# Patient Record
Sex: Female | Born: 1965 | Race: White | Hispanic: No | Marital: Single | Smoking: Current every day smoker
Health system: Southern US, Community
[De-identification: ages and names within clinical notes are randomized; demographics above are authoritative.]

## PROBLEM LIST (undated history)

## (undated) DIAGNOSIS — F329 Major depressive disorder, single episode, unspecified: Secondary | ICD-10-CM

## (undated) DIAGNOSIS — F319 Bipolar disorder, unspecified: Secondary | ICD-10-CM

## (undated) DIAGNOSIS — IMO0002 Reserved for concepts with insufficient information to code with codable children: Secondary | ICD-10-CM

## (undated) DIAGNOSIS — F419 Anxiety disorder, unspecified: Secondary | ICD-10-CM

## (undated) DIAGNOSIS — F32A Depression, unspecified: Secondary | ICD-10-CM

## (undated) HISTORY — PX: TUBAL LIGATION: SHX77

## (undated) HISTORY — PX: APPENDECTOMY: SHX54

---

## 1998-08-04 ENCOUNTER — Other Ambulatory Visit: Admission: RE | Admit: 1998-08-04 | Discharge: 1998-08-04 | Payer: Self-pay | Admitting: Gynecology

## 2007-12-10 ENCOUNTER — Emergency Department (HOSPITAL_COMMUNITY): Admission: EM | Admit: 2007-12-10 | Discharge: 2007-12-10 | Payer: Self-pay | Admitting: Emergency Medicine

## 2009-08-24 ENCOUNTER — Emergency Department (HOSPITAL_COMMUNITY)
Admission: EM | Admit: 2009-08-24 | Discharge: 2009-08-24 | Payer: Self-pay | Source: Home / Self Care | Admitting: Emergency Medicine

## 2012-03-31 ENCOUNTER — Encounter (HOSPITAL_COMMUNITY): Payer: Self-pay | Admitting: Emergency Medicine

## 2012-03-31 ENCOUNTER — Emergency Department (HOSPITAL_COMMUNITY)
Admission: EM | Admit: 2012-03-31 | Discharge: 2012-03-31 | Disposition: A | Discharge status: Home / Self Care | Payer: Medicaid Other | Attending: Emergency Medicine | Admitting: Emergency Medicine

## 2012-03-31 DIAGNOSIS — S51809A Unspecified open wound of unspecified forearm, initial encounter: Secondary | ICD-10-CM | POA: Insufficient documentation

## 2012-03-31 DIAGNOSIS — F3289 Other specified depressive episodes: Secondary | ICD-10-CM | POA: Insufficient documentation

## 2012-03-31 DIAGNOSIS — Y939 Activity, unspecified: Secondary | ICD-10-CM | POA: Insufficient documentation

## 2012-03-31 DIAGNOSIS — Y9289 Other specified places as the place of occurrence of the external cause: Secondary | ICD-10-CM | POA: Insufficient documentation

## 2012-03-31 DIAGNOSIS — IMO0002 Reserved for concepts with insufficient information to code with codable children: Secondary | ICD-10-CM

## 2012-03-31 DIAGNOSIS — Z79899 Other long term (current) drug therapy: Secondary | ICD-10-CM | POA: Insufficient documentation

## 2012-03-31 DIAGNOSIS — F329 Major depressive disorder, single episode, unspecified: Secondary | ICD-10-CM | POA: Insufficient documentation

## 2012-03-31 DIAGNOSIS — F411 Generalized anxiety disorder: Secondary | ICD-10-CM | POA: Insufficient documentation

## 2012-03-31 DIAGNOSIS — W268XXA Contact with other sharp object(s), not elsewhere classified, initial encounter: Secondary | ICD-10-CM | POA: Insufficient documentation

## 2012-03-31 DIAGNOSIS — F17200 Nicotine dependence, unspecified, uncomplicated: Secondary | ICD-10-CM | POA: Insufficient documentation

## 2012-03-31 DIAGNOSIS — F319 Bipolar disorder, unspecified: Secondary | ICD-10-CM | POA: Insufficient documentation

## 2012-03-31 HISTORY — DX: Anxiety disorder, unspecified: F41.9

## 2012-03-31 HISTORY — DX: Bipolar disorder, unspecified: F31.9

## 2012-03-31 HISTORY — DX: Depression, unspecified: F32.A

## 2012-03-31 HISTORY — DX: Major depressive disorder, single episode, unspecified: F32.9

## 2012-03-31 MED ORDER — LIDOCAINE-EPINEPHRINE (PF) 1 %-1:200000 IJ SOLN
10.0000 mL | Freq: Once | INTRAMUSCULAR | Status: AC
  Administered 2012-03-31: 10 mL via INTRADERMAL
  Filled 2012-03-31: qty 10

## 2012-03-31 NOTE — ED Provider Notes (Signed)
History     CSN: [id number]  Arrival date & time 03/31/12  0302   First MD Initiated Contact with Patient 03/31/12 [id number]      Chief Complaint  Patient presents with  . Laceration    (Consider location/radiation/quality/duration/timing/severity/associated sxs/prior treatment) Patient is a 47 y.o. female presenting with skin laceration. The history is provided by the patient (pt states she cut her left forearm accidently).  Laceration Location: left forearm. Length (cm):  5cm Depth:  Cutaneous Quality: straight   Bleeding: controlled   Time since incident: 2 hours ago. Laceration mechanism:  Metal edge Pain details:    Quality:  Aching   Severity:  Mild   Timing:  Constant   Past Medical History  Diagnosis Date  . Anxiety   . Depression   . Bipolar 1 disorder     Past Surgical History  Procedure Laterality Date  . Appendectomy    . Tubal ligation      History reviewed. No pertinent family history.  History  Substance Use Topics  . Smoking status: Current Every Day Smoker    Types: Cigarettes  . Smokeless tobacco: Not on file  . Alcohol Use: Yes    OB History   Grav Para Term Preterm Abortions TAB SAB Ect Mult Living                  Review of Systems  Constitutional: Negative for fatigue.  HENT: Negative for congestion, sinus pressure and ear discharge.   Eyes: Negative for discharge.  Respiratory: Negative for cough.   Cardiovascular: Negative for chest pain.  Gastrointestinal: Negative for abdominal pain and diarrhea.  Genitourinary: Negative for frequency and hematuria.  Musculoskeletal: Negative for back pain.       Lac.  Right forearm  Skin: Negative for rash.  Neurological: Negative for seizures and headaches.  Psychiatric/Behavioral: Negative for hallucinations.    Allergies  Codeine and Penicillins  Home Medications   Current Outpatient Rx  Name  Route  Sig  Dispense  Refill  . amitriptyline (ELAVIL) 150 MG tablet   Oral   Take  150 mg by mouth at bedtime.         Marland Kitchen ibuprofen (ADVIL,MOTRIN) 800 MG tablet   Oral   Take 800 mg by mouth every 8 (eight) hours as needed for pain.         Marland Kitchen PARoxetine (PAXIL) 40 MG tablet   Oral   Take 40 mg by mouth every morning.           BP 102/49  Pulse 105  Temp(Src) 97.6 F (36.4 C) (Oral)  Resp 20  Ht 5\' 8"  (1.727 m)  Wt 125 lb (56.7 kg)  BMI 19.01 kg/m2  SpO2 96%  LMP 03/18/2012  Physical Exam  Constitutional: She is oriented to person, place, and time. She appears well-developed.  HENT:  Head: Normocephalic.  Eyes: Conjunctivae are normal.  Neck: No tracheal deviation present.  Cardiovascular:  No murmur heard. Musculoskeletal: Normal range of motion.  5cm lac superficial left forearm. neurovasc normal  Neurological: She is oriented to person, place, and time.  Skin: Skin is warm.  Psychiatric: She has a normal mood and affect.    ED Course  LACERATION REPAIR Date/Time: 03/31/2012 3:57 AM Performed by: Shemeika Starzyk L Authorized by: Bethann Berkshire L Comments: 5 cm  Superficial laceration to right forearm closed with 6 4-0 nylon sutures.  Area numbed with lidocaine and epi.  Pt tolerated the procedure well   (including  critical care time)  Labs Reviewed - No data to display No results found.   1. Laceration       MDM          Benny Lennert, MD 03/31/12 [phone]

## 2012-03-31 NOTE — ED Notes (Signed)
Pt has laceration to the rt forearm from reaching under an entertainment center.

## 2012-04-10 ENCOUNTER — Encounter (HOSPITAL_COMMUNITY): Payer: Self-pay | Admitting: Emergency Medicine

## 2012-04-10 ENCOUNTER — Emergency Department (HOSPITAL_COMMUNITY)
Admission: EM | Admit: 2012-04-10 | Discharge: 2012-04-10 | Disposition: A | Discharge status: Home / Self Care | Payer: Medicaid Other | Attending: Emergency Medicine | Admitting: Emergency Medicine

## 2012-04-10 DIAGNOSIS — F411 Generalized anxiety disorder: Secondary | ICD-10-CM | POA: Insufficient documentation

## 2012-04-10 DIAGNOSIS — K029 Dental caries, unspecified: Secondary | ICD-10-CM

## 2012-04-10 DIAGNOSIS — Z79899 Other long term (current) drug therapy: Secondary | ICD-10-CM | POA: Insufficient documentation

## 2012-04-10 DIAGNOSIS — K089 Disorder of teeth and supporting structures, unspecified: Secondary | ICD-10-CM | POA: Insufficient documentation

## 2012-04-10 DIAGNOSIS — F17200 Nicotine dependence, unspecified, uncomplicated: Secondary | ICD-10-CM | POA: Insufficient documentation

## 2012-04-10 DIAGNOSIS — Z4802 Encounter for removal of sutures: Secondary | ICD-10-CM

## 2012-04-10 DIAGNOSIS — F3289 Other specified depressive episodes: Secondary | ICD-10-CM | POA: Insufficient documentation

## 2012-04-10 DIAGNOSIS — Z4800 Encounter for change or removal of nonsurgical wound dressing: Secondary | ICD-10-CM | POA: Insufficient documentation

## 2012-04-10 MED ORDER — HYDROCODONE-ACETAMINOPHEN 10-500 MG PO TABS: 1.0000 | ORAL_TABLET | Freq: Four times a day (QID) | ORAL | Status: DC | PRN

## 2012-04-10 MED ORDER — OXYCODONE-ACETAMINOPHEN 5-325 MG PO TABS: 1.0000 | ORAL_TABLET | Freq: Four times a day (QID) | ORAL | Status: DC | PRN

## 2012-04-10 MED ORDER — CLINDAMYCIN HCL 300 MG PO CAPS: 300.0000 mg | ORAL_CAPSULE | Freq: Three times a day (TID) | ORAL | Status: DC

## 2012-04-10 NOTE — ED Provider Notes (Signed)
History  This chart was scribed for Ebbie Ridge, non-physician practitioner, working with Raeford Razor, MD by Bennett Scrape, ED Scribe. This patient was seen in room WTR5/WTR5 and the patient's care was started at 3:54 PM.  CSN: [id number]  Arrival date & time 04/10/12  1550   First MD Initiated Contact with Patient 04/10/12 1554      No chief complaint on file.    The history is provided by the patient. No language interpreter was used.    Tracie Jimenez is a 47 y.o. female who presents to the Emergency Department for suture removal. Pt was seen at [facility] ED 10 days ago for the same and had 6 sutures placed. She reports soreness and states that 3 "busted on their own" but denies drainage. She also c/o dental pain after a right upper tooth broke 2 days ago. She reports taking "someone's 10 mg percocet" with no improvement. She denies having a dentist to follow up with.She denies fevers, nausea, and emesis as associated symptoms.  Past Medical History  Diagnosis Date  . Anxiety   . Depression   . Bipolar 1 disorder     Past Surgical History  Procedure Laterality Date  . Appendectomy    . Tubal ligation      No family history on file.  History  Substance Use Topics  . Smoking status: Current Every Day Smoker    Types: Cigarettes  . Smokeless tobacco: Not on file  . Alcohol Use: Yes    No OB history provided.  Review of Systems  A complete 10 system review of systems was obtained and all systems are negative except as noted in the HPI and PMH.   Allergies  Codeine and Penicillins  Home Medications   Current Outpatient Rx  Name  Route  Sig  Dispense  Refill  . amitriptyline (ELAVIL) 150 MG tablet   Oral   Take 150 mg by mouth at bedtime.         Marland Kitchen ibuprofen (ADVIL,MOTRIN) 800 MG tablet   Oral   Take 800 mg by mouth every 8 (eight) hours as needed for pain.         Marland Kitchen PARoxetine (PAXIL) 40 MG tablet   Oral   Take 40 mg by mouth every morning.            Triage Vitals: BP 98/69  Pulse 75  Temp(Src) 98 F (36.7 C) (Oral)  SpO2 100%  LMP 03/18/2012   Physical Exam  Nursing note and vitals reviewed. Constitutional: She is oriented to person, place, and time. She appears well-developed and well-nourished. No distress.  HENT:  Head: Normocephalic and atraumatic.  Dental decay on throughout, several broken teeth on the right  Eyes: Conjunctivae and EOM are normal.  Neck: Neck supple. No tracheal deviation present.  Cardiovascular: Normal rate.   Pulmonary/Chest: Effort normal. No respiratory distress.  Musculoskeletal: Normal range of motion.  Neurological: She is alert and oriented to person, place, and time. She has normal strength. No sensory deficit. She displays no seizure activity.  Skin: Skin is warm and dry.  Mild erythema surrounding the sutured area on the right forearm, no other signs of infection  Psychiatric: She has a normal mood and affect.    ED Course  Procedures (including critical care time)  DIAGNOSTIC STUDIES: Oxygen Saturation is 100% on room air, normal by my interpretation.    COORDINATION OF CARE: 3:58 PM-Informed pt of suture removal plan. Discussed discharge plan which includes dental  referral with pt and pt agreed to plan.   The patient is advised to return here as needed.Sutures were removed. The patient is referred to dentistry as well.    MDM  I personally performed the services described in this documentation, which was scribed in my presence. The recorded information has been reviewed and is accurate.    Carlyle Dolly, PA-C 04/10/12 1626

## 2012-04-10 NOTE — ED Provider Notes (Signed)
Medical screening examination/treatment/procedure(s) were performed by non-physician practitioner and as supervising physician I was immediately available for consultation/collaboration.  Raeford Razor, MD 04/10/12 1640

## 2012-04-10 NOTE — ED Notes (Signed)
Pt states that she needs her sutures removed from 2/14 on her R forearm and she also has a tooth that broke on her R upper side that is now cutting into her jaw. Painful. Unable to eat on that side.

## 2012-05-21 ENCOUNTER — Emergency Department (HOSPITAL_COMMUNITY)
Admission: EM | Admit: 2012-05-21 | Discharge: 2012-05-22 | Disposition: A | Discharge status: Home / Self Care | Payer: Medicaid Other | Attending: Emergency Medicine | Admitting: Emergency Medicine

## 2012-05-21 DIAGNOSIS — K089 Disorder of teeth and supporting structures, unspecified: Secondary | ICD-10-CM | POA: Insufficient documentation

## 2012-05-21 DIAGNOSIS — Z79899 Other long term (current) drug therapy: Secondary | ICD-10-CM | POA: Insufficient documentation

## 2012-05-21 DIAGNOSIS — F319 Bipolar disorder, unspecified: Secondary | ICD-10-CM | POA: Insufficient documentation

## 2012-05-21 DIAGNOSIS — F411 Generalized anxiety disorder: Secondary | ICD-10-CM | POA: Insufficient documentation

## 2012-05-21 DIAGNOSIS — K0889 Other specified disorders of teeth and supporting structures: Secondary | ICD-10-CM

## 2012-05-21 DIAGNOSIS — F17200 Nicotine dependence, unspecified, uncomplicated: Secondary | ICD-10-CM | POA: Insufficient documentation

## 2012-05-21 MED ORDER — HYDROCODONE-ACETAMINOPHEN 5-325 MG PO TABS: ORAL_TABLET | ORAL | Status: DC

## 2012-05-21 MED ORDER — CLINDAMYCIN HCL 150 MG PO CAPS: 300.0000 mg | ORAL_CAPSULE | Freq: Four times a day (QID) | ORAL | Status: DC

## 2012-05-21 MED ORDER — IBUPROFEN 600 MG PO TABS: 600.0000 mg | ORAL_TABLET | Freq: Four times a day (QID) | ORAL | Status: DC | PRN

## 2012-05-21 NOTE — ED Provider Notes (Signed)
History    This chart was scribed for non-physician practitioner, Renne Crigler PA-C working with Sunnie Nielsen, MD by Smitty Pluck, ED scribe. This patient was seen in room WTR5/WTR5 and the patient's care was started at 11: 51 PM.   CSN: [id number]  Arrival date & time 05/21/12  2341        Chief Complaint  Patient presents with  . Dental Pain    The history is provided by the patient. No language interpreter was used.   Tracie Jimenez is a 47 y.o. female who presents to the Emergency Department complaining of constant, moderate throbbing left dental pain onset 3 days ago. Pt reports she has sensitivity to air and when she breathes pain is aggravated. Pt states she has taken ibuprofen and percocet-10 without relief. Pt denies fever, chills, nausea, vomiting, diarrhea, weakness, cough, SOB and any other pain.    Past Medical History  Diagnosis Date  . Anxiety   . Depression   . Bipolar 1 disorder     Past Surgical History  Procedure Laterality Date  . Appendectomy    . Tubal ligation      No family history on file.  History  Substance Use Topics  . Smoking status: Current Every Day Smoker    Types: Cigarettes  . Smokeless tobacco: Not on file  . Alcohol Use: Yes    OB History   Grav Para Term Preterm Abortions TAB SAB Ect Mult Living                  Review of Systems  Constitutional: Negative for fever and chills.  HENT: Positive for dental problem. Negative for ear pain, sore throat, facial swelling, trouble swallowing and neck pain.   Respiratory: Negative for cough, shortness of breath and stridor.   Gastrointestinal: Negative for nausea and vomiting.  Skin: Negative for color change.  Neurological: Negative for weakness and headaches.    Allergies  Codeine and Penicillins  Home Medications   Current Outpatient Rx  Name  Route  Sig  Dispense  Refill  . albuterol (PROVENTIL HFA;VENTOLIN HFA) 108 (90 BASE) MCG/ACT inhaler   Inhalation   Inhale 2  puffs into the lungs every 6 (six) hours as needed for wheezing. wheezing         . ALPRAZolam (XANAX) 0.5 MG tablet   Oral   Take 0.5 mg by mouth at bedtime as needed for sleep. Anxiety         . amitriptyline (ELAVIL) 150 MG tablet   Oral   Take 150 mg by mouth at bedtime.         . clindamycin (CLEOCIN) 300 MG capsule   Oral   Take 1 capsule (300 mg total) by mouth 3 (three) times daily.   21 capsule   0   . oxyCODONE-acetaminophen (PERCOCET/ROXICET) 5-325 MG per tablet   Oral   Take 1 tablet by mouth every 6 (six) hours as needed for pain.   10 tablet   0   . PARoxetine (PAXIL) 40 MG tablet   Oral   Take 40 mg by mouth every morning.           BP 123/78  Pulse 89  Temp(Src) 98.7 F (37.1 C) (Oral)  Resp 20  SpO2 99%  Physical Exam  Nursing note and vitals reviewed. Constitutional: She appears well-developed and well-nourished. No distress.  HENT:  Head: Normocephalic and atraumatic. No trismus in the jaw.  Right Ear: Tympanic membrane, external ear  and ear canal normal.  Left Ear: Tympanic membrane, external ear and ear canal normal.  Nose: Nose normal.  Mouth/Throat: Uvula is midline, oropharynx is clear and moist and mucous membranes are normal. Abnormal dentition. Dental caries present. No dental abscesses or edematous. No tonsillar abscesses.  Advanced periodontal disease The left first molar is absent and second molar is broken Multiple caries are present  No gross abscess   Eyes: Conjunctivae and EOM are normal.  Neck: Normal range of motion. Neck supple. No tracheal deviation present.  No neck swelling or Ludwig's angina  Cardiovascular: Normal rate.   Pulmonary/Chest: Effort normal. No respiratory distress.  Musculoskeletal: Normal range of motion.  Lymphadenopathy:    She has no cervical adenopathy.  Neurological: She is alert.  Skin: Skin is warm and dry.  Psychiatric: She has a normal mood and affect. Her behavior is normal.    ED  Course  Procedures (including critical care time)     COORDINATION OF CARE: 11:56 PM Discussed ED treatment with pt and pt agrees.   Filed Vitals:   05/22/12 0011  BP: 123/78  Pulse: 89  Temp: 98.7 F (37.1 C)  TempSrc: Oral  Resp: 20  SpO2: 99%     Labs Reviewed - No data to display No results found.   1. Pain, dental     12:32 AM Patient seen and examined. Work-up initiated. Medications ordered.   Vital signs reviewed and are as follows: Filed Vitals:   05/22/12 0011  BP: 123/78  Pulse: 89  Temp: 98.7 F (37.1 C)  Resp: 20    Patient counseled to take prescribed medications as directed, return with worsening facial or neck swelling, and to follow-up with their dentist as soon as possible.   Dental block was performed. 1.31mL of 2% lidocaine with epi was combined with 1.7mL 0.5% bupivacaine with epi and an alveolar block was performed. Injections made at base of tooth as well as the tooth immediately posterior and anterior to tooth. Adequate anesthesia was obtained. Minimal bleeding after injections. Patient tolerated procedure well with no immediate complications.   Patient counseled on use of narcotic pain medications. Counseled not to combine these medications with others containing tylenol. Urged not to drink alcohol, drive, or perform any other activities that requires focus while taking these medications. The patient verbalizes understanding and agrees with the plan.    MDM  Patient with toothache.  No gross abscess.  Exam unconcerning for Ludwig's angina or other deep tissue infection in neck.  Will treat with clinda (pen allergy) and pain medicine.  Urged patient to follow-up with dentist.        I personally performed the services described in this documentation, which was scribed in my presence. The recorded information has been reviewed and is accurate.    Renne Crigler, PA-C 05/22/12 [phone]

## 2012-05-22 ENCOUNTER — Encounter (HOSPITAL_COMMUNITY): Payer: Self-pay | Admitting: Family Medicine

## 2012-05-22 MED ORDER — IBUPROFEN 600 MG PO TABS: 600.0000 mg | ORAL_TABLET | Freq: Four times a day (QID) | ORAL | Status: DC | PRN

## 2012-05-22 MED ORDER — BUPIVACAINE-EPINEPHRINE (PF) 0.5% -1:200000 IJ SOLN
INTRAMUSCULAR | Status: AC
  Administered 2012-05-22: 150 mg
  Filled 2012-05-22: qty 10

## 2012-05-22 MED ORDER — OXYCODONE-ACETAMINOPHEN 5-325 MG PO TABS: 1.0000 | ORAL_TABLET | Freq: Four times a day (QID) | ORAL | Status: DC | PRN

## 2012-05-22 MED ORDER — CLINDAMYCIN HCL 150 MG PO CAPS: 300.0000 mg | ORAL_CAPSULE | Freq: Four times a day (QID) | ORAL | Status: DC

## 2012-05-22 MED FILL — Bupivacaine Inj 0.5% w/ Epinephrine 1:200000: INTRAMUSCULAR | Qty: 50 | Status: AC

## 2012-05-22 NOTE — ED Provider Notes (Signed)
Medical screening examination/treatment/procedure(s) were performed by non-physician practitioner and as supervising physician I was immediately available for consultation/collaboration.  Canyon Willow, MD 05/22/12 0222 

## 2012-05-22 NOTE — ED Notes (Signed)
Patient states that she has had a toothache for the past 3 days.

## 2012-05-25 ENCOUNTER — Encounter (HOSPITAL_COMMUNITY): Payer: Self-pay | Admitting: Family Medicine

## 2012-05-25 ENCOUNTER — Emergency Department (HOSPITAL_COMMUNITY)
Admission: EM | Admit: 2012-05-25 | Discharge: 2012-05-25 | Disposition: A | Discharge status: Home / Self Care | Payer: Medicaid Other | Attending: Emergency Medicine | Admitting: Emergency Medicine

## 2012-05-25 DIAGNOSIS — K0889 Other specified disorders of teeth and supporting structures: Secondary | ICD-10-CM

## 2012-05-25 DIAGNOSIS — F329 Major depressive disorder, single episode, unspecified: Secondary | ICD-10-CM | POA: Insufficient documentation

## 2012-05-25 DIAGNOSIS — F319 Bipolar disorder, unspecified: Secondary | ICD-10-CM | POA: Insufficient documentation

## 2012-05-25 DIAGNOSIS — Z79899 Other long term (current) drug therapy: Secondary | ICD-10-CM | POA: Insufficient documentation

## 2012-05-25 DIAGNOSIS — F411 Generalized anxiety disorder: Secondary | ICD-10-CM | POA: Insufficient documentation

## 2012-05-25 DIAGNOSIS — K089 Disorder of teeth and supporting structures, unspecified: Secondary | ICD-10-CM | POA: Insufficient documentation

## 2012-05-25 DIAGNOSIS — F3289 Other specified depressive episodes: Secondary | ICD-10-CM | POA: Insufficient documentation

## 2012-05-25 DIAGNOSIS — F17200 Nicotine dependence, unspecified, uncomplicated: Secondary | ICD-10-CM | POA: Insufficient documentation

## 2012-05-25 MED ORDER — OXYCODONE-ACETAMINOPHEN 5-325 MG PO TABS
2.0000 | ORAL_TABLET | Freq: Once | ORAL | Status: AC
  Administered 2012-05-25: 2 via ORAL
  Filled 2012-05-25: qty 2

## 2012-05-25 MED ORDER — BUPIVACAINE-EPINEPHRINE PF 0.5-1:200000 % IJ SOLN
1.8000 mL | Freq: Once | INTRAMUSCULAR | Status: DC
  Filled 2012-05-25: qty 1.8

## 2012-05-25 MED ORDER — OXYCODONE-ACETAMINOPHEN 5-325 MG PO TABS: 2.0000 | ORAL_TABLET | Freq: Four times a day (QID) | ORAL | Status: DC | PRN

## 2012-05-25 NOTE — ED Provider Notes (Signed)
Medical screening examination/treatment/procedure(s) were performed by non-physician practitioner and as supervising physician I was immediately available for consultation/collaboration.  Gilda Crease, MD 05/25/12 2229

## 2012-05-25 NOTE — ED Notes (Signed)
Patient states she was seen here on 4/6 and was given medication for pain and a resource guide. States that she called to make a dental appointment and was told the soonest she could be seen would be next Tuesday; patient did not take this appointment. States the pain is getting worse and she is having to double up on her medicine to control her pain. Bruising and edema noted to left jaw.

## 2012-05-25 NOTE — ED Notes (Signed)
Pt states she has a ride home. 

## 2012-05-25 NOTE — ED Provider Notes (Signed)
History     CSN: [id number]  Arrival date & time 05/25/12  1949   First MD Initiated Contact with Patient 05/25/12 2000      Chief Complaint  Patient presents with  . Dental Pain    (Consider location/radiation/quality/duration/timing/severity/associated sxs/prior treatment) HPI Comments: Patient presents to the emergency department with a dental complaint. Symptoms began one week ago. The patient has tried to alleviate pain with percocet and clindamycin.  Pain rated at a 10/10, characterized as throbbing in nature and located left upper teeth. Patient endorses subjective fever, but did not take her temperature.  She is afebrile here. Denies night sweats, chills, difficulty swallowing or opening mouth, SOB, nuchal rigidity or decreased ROM of neck.  Patient states that she has a dentist appointment next Tuesday.   The history is provided by the patient. No language interpreter was used.    Past Medical History  Diagnosis Date  . Anxiety   . Depression   . Bipolar 1 disorder     Past Surgical History  Procedure Laterality Date  . Appendectomy    . Tubal ligation      No family history on file.  History  Substance Use Topics  . Smoking status: Current Every Day Smoker    Types: Cigarettes  . Smokeless tobacco: Not on file  . Alcohol Use: Yes    OB History   Grav Para Term Preterm Abortions TAB SAB Ect Mult Living                  Review of Systems  All other systems reviewed and are negative.    Allergies  Codeine and Penicillins  Home Medications   Current Outpatient Rx  Name  Route  Sig  Dispense  Refill  . albuterol (PROVENTIL HFA;VENTOLIN HFA) 108 (90 BASE) MCG/ACT inhaler   Inhalation   Inhale 2 puffs into the lungs every 6 (six) hours as needed for wheezing. wheezing         . ALPRAZolam (XANAX) 0.5 MG tablet   Oral   Take 0.5 mg by mouth at bedtime as needed for sleep. Anxiety         . amitriptyline (ELAVIL) 150 MG tablet   Oral   Take  150 mg by mouth at bedtime.         . clindamycin (CLEOCIN) 150 MG capsule   Oral   Take 2 capsules (300 mg total) by mouth every 6 (six) hours.   56 capsule   0   . ibuprofen (ADVIL,MOTRIN) 600 MG tablet   Oral   Take 1 tablet (600 mg total) by mouth every 6 (six) hours as needed for pain.   20 tablet   0   . oxyCODONE-acetaminophen (PERCOCET/ROXICET) 5-325 MG per tablet   Oral   Take 1-2 tablets by mouth every 6 (six) hours as needed for pain.   8 tablet   0   . PARoxetine (PAXIL) 40 MG tablet   Oral   Take 40 mg by mouth every morning.           BP 138/84  Pulse 95  Temp(Src) 98.7 F (37.1 C) (Oral)  Resp 20  SpO2 100%  LMP 05/02/2012  Physical Exam  Nursing note and vitals reviewed. Constitutional: She is oriented to person, place, and time. She appears well-developed and well-nourished.  HENT:  Head: Normocephalic and atraumatic.  Mouth/Throat:    Poor dentition throughout.  Affected tooth as diagrammed.  No signs of peritonsillar or tonsillar  abscess.  No signs of gingival abscess. Oropharynx is clear and without exudates.  Uvula is midline.  Airway is intact. No signs of Ludwig's angina.   Eyes: Conjunctivae and EOM are normal.  Neck: Normal range of motion.  Cardiovascular: Normal rate.   Pulmonary/Chest: Effort normal.  Abdominal: She exhibits no distension.  Musculoskeletal: Normal range of motion.  Neurological: She is alert and oriented to person, place, and time.  Skin: Skin is dry.  Psychiatric: She has a normal mood and affect. Her behavior is normal. Judgment and thought content normal.    ED Course  Dental Date/Time: 05/25/2012 8:49 PM Performed by: Roxy Horseman Authorized by: Roxy Horseman Consent: Verbal consent obtained. Risks and benefits: risks, benefits and alternatives were discussed Consent given by: patient Patient understanding: patient states understanding of the procedure being performed Patient consent: the  patient's understanding of the procedure matches consent given Procedure consent: procedure consent matches procedure scheduled Relevant documents: relevant documents present and verified Test results: test results available and properly labeled Imaging studies: imaging studies available Required items: required blood products, implants, devices, and special equipment available Patient identity confirmed: verbally with patient Time out: Immediately prior to procedure a "time out" was called to verify the correct patient, procedure, equipment, support staff and site/side marked as required. Local anesthesia used: yes Anesthesia: local infiltration Local anesthetic: bupivacaine 0.5% with epinephrine Anesthetic total: 1.8 ml Patient sedated: no Patient tolerance: Patient tolerated the procedure well with no immediate complications.   (including critical care time)  Labs Reviewed - No data to display No results found.   1. Pain, dental       MDM  Patient with uncomplicated dental pain. She was seen here 4 days ago for the same. Patient states that she has run out of her pain medicine, and was unable to followup with a dentist yet. She has an appointment scheduled for Tuesday. She has been taking clindamycin as prescribed. She requests another dental block, and additional pain medicine. I told her that I will give her more pain medicine, but that if she returns without having seen the dentist, that we will be unable to refill her prescription again. She is afebrile, there is no signs of abscess. Patient is stable and ready for discharge.       Roxy Horseman, PA-C 05/25/12 2050

## 2012-10-30 ENCOUNTER — Emergency Department (HOSPITAL_COMMUNITY)
Admission: EM | Admit: 2012-10-30 | Discharge: 2012-10-30 | Disposition: A | Discharge status: Home / Self Care | Payer: Medicaid Other | Attending: Emergency Medicine | Admitting: Emergency Medicine

## 2012-10-30 ENCOUNTER — Encounter (HOSPITAL_COMMUNITY): Payer: Self-pay | Admitting: *Deleted

## 2012-10-30 DIAGNOSIS — S0993XA Unspecified injury of face, initial encounter: Secondary | ICD-10-CM | POA: Insufficient documentation

## 2012-10-30 DIAGNOSIS — S8990XA Unspecified injury of unspecified lower leg, initial encounter: Secondary | ICD-10-CM | POA: Insufficient documentation

## 2012-10-30 DIAGNOSIS — W172XXA Fall into hole, initial encounter: Secondary | ICD-10-CM | POA: Insufficient documentation

## 2012-10-30 DIAGNOSIS — S40269A Insect bite (nonvenomous) of unspecified shoulder, initial encounter: Secondary | ICD-10-CM | POA: Insufficient documentation

## 2012-10-30 DIAGNOSIS — R197 Diarrhea, unspecified: Secondary | ICD-10-CM | POA: Insufficient documentation

## 2012-10-30 DIAGNOSIS — Z880 Allergy status to penicillin: Secondary | ICD-10-CM | POA: Insufficient documentation

## 2012-10-30 DIAGNOSIS — F411 Generalized anxiety disorder: Secondary | ICD-10-CM | POA: Insufficient documentation

## 2012-10-30 DIAGNOSIS — Y9289 Other specified places as the place of occurrence of the external cause: Secondary | ICD-10-CM | POA: Insufficient documentation

## 2012-10-30 DIAGNOSIS — F319 Bipolar disorder, unspecified: Secondary | ICD-10-CM | POA: Insufficient documentation

## 2012-10-30 DIAGNOSIS — Z79899 Other long term (current) drug therapy: Secondary | ICD-10-CM | POA: Insufficient documentation

## 2012-10-30 DIAGNOSIS — F17200 Nicotine dependence, unspecified, uncomplicated: Secondary | ICD-10-CM | POA: Insufficient documentation

## 2012-10-30 DIAGNOSIS — IMO0002 Reserved for concepts with insufficient information to code with codable children: Secondary | ICD-10-CM | POA: Insufficient documentation

## 2012-10-30 DIAGNOSIS — W57XXXA Bitten or stung by nonvenomous insect and other nonvenomous arthropods, initial encounter: Secondary | ICD-10-CM | POA: Insufficient documentation

## 2012-10-30 DIAGNOSIS — R509 Fever, unspecified: Secondary | ICD-10-CM | POA: Insufficient documentation

## 2012-10-30 DIAGNOSIS — Y9389 Activity, other specified: Secondary | ICD-10-CM | POA: Insufficient documentation

## 2012-10-30 DIAGNOSIS — G8929 Other chronic pain: Secondary | ICD-10-CM

## 2012-10-30 MED ORDER — HYDROCODONE-IBUPROFEN 7.5-200 MG PO TABS: 1.0000 | ORAL_TABLET | Freq: Four times a day (QID) | ORAL | Status: DC | PRN

## 2012-10-30 MED ORDER — OXYCODONE HCL 5 MG PO TABS: 5.0000 mg | ORAL_TABLET | ORAL | Status: DC | PRN
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
## 2012-10-30 NOTE — ED Notes (Signed)
Pt states has spider bite right upper chest that has gotten bigger; states feel in a hole yesterday; c/o knee and lower back pain; history of chronic pain;

## 2012-10-31 NOTE — ED Provider Notes (Signed)
Medical screening examination/treatment/procedure(s) were performed by non-physician practitioner and as supervising physician I was immediately available for consultation/collaboration.    Carver Murakami J. Tali Cleaves, MD 10/31/12 1709 

## 2013-04-16 ENCOUNTER — Emergency Department (HOSPITAL_COMMUNITY): Payer: Medicaid Other

## 2013-04-16 ENCOUNTER — Encounter (HOSPITAL_COMMUNITY): Payer: Self-pay | Admitting: Emergency Medicine

## 2013-04-16 ENCOUNTER — Emergency Department (HOSPITAL_COMMUNITY)
Admission: EM | Admit: 2013-04-16 | Discharge: 2013-04-16 | Disposition: A | Discharge status: Home / Self Care | Payer: Medicaid Other | Attending: Emergency Medicine | Admitting: Emergency Medicine

## 2013-04-16 DIAGNOSIS — F17200 Nicotine dependence, unspecified, uncomplicated: Secondary | ICD-10-CM | POA: Insufficient documentation

## 2013-04-16 DIAGNOSIS — F319 Bipolar disorder, unspecified: Secondary | ICD-10-CM | POA: Insufficient documentation

## 2013-04-16 DIAGNOSIS — W010XXA Fall on same level from slipping, tripping and stumbling without subsequent striking against object, initial encounter: Secondary | ICD-10-CM | POA: Insufficient documentation

## 2013-04-16 DIAGNOSIS — Z79899 Other long term (current) drug therapy: Secondary | ICD-10-CM | POA: Insufficient documentation

## 2013-04-16 DIAGNOSIS — IMO0002 Reserved for concepts with insufficient information to code with codable children: Secondary | ICD-10-CM | POA: Insufficient documentation

## 2013-04-16 DIAGNOSIS — Y9389 Activity, other specified: Secondary | ICD-10-CM | POA: Insufficient documentation

## 2013-04-16 DIAGNOSIS — F411 Generalized anxiety disorder: Secondary | ICD-10-CM | POA: Insufficient documentation

## 2013-04-16 DIAGNOSIS — M545 Low back pain, unspecified: Secondary | ICD-10-CM

## 2013-04-16 DIAGNOSIS — Y9289 Other specified places as the place of occurrence of the external cause: Secondary | ICD-10-CM | POA: Insufficient documentation

## 2013-04-16 DIAGNOSIS — W19XXXA Unspecified fall, initial encounter: Secondary | ICD-10-CM

## 2013-04-16 MED ORDER — PREDNISONE 20 MG PO TABS
60.0000 mg | ORAL_TABLET | Freq: Once | ORAL | Status: AC
  Administered 2013-04-16: 60 mg via ORAL
  Filled 2013-04-16: qty 3

## 2013-04-16 MED ORDER — KETOROLAC TROMETHAMINE 60 MG/2ML IM SOLN
60.0000 mg | Freq: Once | INTRAMUSCULAR | Status: DC
Start: 2013-04-16 — End: 2013-04-16

## 2013-04-16 MED ORDER — CYCLOBENZAPRINE HCL 10 MG PO TABS: 10.0000 mg | ORAL_TABLET | Freq: Two times a day (BID) | ORAL | Status: DC | PRN

## 2013-04-16 MED ORDER — PROMETHAZINE HCL 25 MG PO TABS: 25.0000 mg | ORAL_TABLET | Freq: Once | ORAL | Status: DC

## 2013-04-16 MED ORDER — OXYCODONE HCL 5 MG PO TABS: 5.0000 mg | ORAL_TABLET | ORAL | Status: DC | PRN

## 2013-04-16 MED ORDER — HYDROMORPHONE HCL PF 1 MG/ML IJ SOLN
1.0000 mg | Freq: Once | INTRAMUSCULAR | Status: AC
  Administered 2013-04-16: 1 mg via INTRAMUSCULAR
  Filled 2013-04-16: qty 1

## 2013-04-16 NOTE — Discharge Instructions (Signed)
Back Injury Prevention °Back injuries can be extremely painful and difficult to heal. After having one back injury, you are much more likely to experience another later on. It is important to learn how to avoid injuring or re-injuring your back. The following tips can help you to prevent a back injury. °PHYSICAL FITNESS °· Exercise regularly and try to develop good tone in your abdominal muscles. Your abdominal muscles provide a lot of the support needed by your back. °· Do aerobic exercises (walking, jogging, biking, swimming) regularly. °· Do exercises that increase balance and strength (tai chi, yoga) regularly. This can decrease your risk of falling and injuring your back. °· Stretch before and after exercising. °· Maintain a healthy weight. The more you weigh, the more stress is placed on your back. For every pound of weight, 10 times that amount of pressure is placed on the back. °DIET °· Talk to your caregiver about how much calcium and vitamin D you need per day. These nutrients help to prevent weakening of the bones (osteoporosis). Osteoporosis can cause broken (fractured) bones that lead to back pain. °· Include good sources of calcium in your diet, such as dairy products, green, leafy vegetables, and products with calcium added (fortified). °· Include good sources of vitamin D in your diet, such as milk and foods that are fortified with vitamin D. °· Consider taking a nutritional supplement or a multivitamin if needed. °· Stop smoking if you smoke. °POSTURE °· Sit and stand up straight. Avoid leaning forward when you sit or hunching over when you stand. °· Choose chairs with good low back (lumbar) support. °· If you work at a desk, sit close to your work so you do not need to lean over. Keep your chin tucked in. Keep your neck drawn back and elbows bent at a right angle. Your arms should look like the letter "L." °· Sit high and close to the steering wheel when you drive. Add a lumbar support to your car  seat if needed. °· Avoid sitting or standing in one position for too long. Take breaks to get up, stretch, and walk around at least once every hour. Take breaks if you are driving for long periods of time. °· Sleep on your side with your knees slightly bent, or sleep on your back with a pillow under your knees. Do not sleep on your stomach. °LIFTING, TWISTING, AND REACHING °· Avoid heavy lifting, especially repetitive lifting. If you must do heavy lifting: °· Stretch before lifting. °· Work slowly. °· Rest between lifts. °· Use carts and dollies to move objects when possible. °· Make several small trips instead of carrying 1 heavy load. °· Ask for help when you need it. °· Ask for help when moving big, awkward objects. °· Follow these steps when lifting: °· Stand with your feet shoulder-width apart. °· Get as close to the object as you can. Do not try to pick up heavy objects that are far from your body. °· Use handles or lifting straps if they are available. °· Bend at your knees. Squat down, but keep your heels off the floor. °· Keep your shoulders pulled back, your chin tucked in, and your back straight. °· Lift the object slowly, tightening the muscles in your legs, abdomen, and buttocks. Keep the object as close to the center of your body as possible. °· When you put a load down, use these same guidelines in reverse. °· Do not: °· Lift the object above your waist. °·   Twist at the waist while lifting or carrying a load. Move your feet if you need to turn, not your waist.  Bend over without bending at your knees.  Avoid reaching over your head, across a table, or for an object on a high surface. OTHER TIPS  Avoid wet floors and keep sidewalks clear of ice to prevent falls.  Do not sleep on a mattress that is too soft or too hard.  Keep items that are used frequently within easy reach.  Put heavier objects on shelves at waist level and lighter objects on lower or higher shelves.  Find ways to  decrease your stress, such as exercise, massage, or relaxation techniques. Stress can build up in your muscles. Tense muscles are more vulnerable to injury.  Seek treatment for depression or anxiety if needed. These conditions can increase your risk of developing back pain. SEEK MEDICAL CARE IF:  You injure your back.  You have questions about diet, exercise, or other ways to prevent back injuries. MAKE SURE YOU:  Understand these instructions.  Will watch your condition.  Will get help right away if you are not doing well or get worse. Document Released: 03/11/2004 Document Revised: 04/26/2011 Document Reviewed: 03/15/2011 [facility] Patient Information 2014 [place], [place].  Back Pain, Adult Low back pain is very common. About 1 in 5 people have back pain.The cause of low back pain is rarely dangerous. The pain often gets better over time.About half of people with a sudden onset of back pain feel better in just 2 weeks. About 8 in 10 people feel better by 6 weeks.  CAUSES Some common causes of back pain include:  Strain of the muscles or ligaments supporting the spine.  Wear and tear (degeneration) of the spinal discs.  Arthritis.  Direct injury to the back. DIAGNOSIS Most of the time, the direct cause of low back pain is not known.However, back pain can be treated effectively even when the exact cause of the pain is unknown.Answering your caregiver's questions about your overall health and symptoms is one of the most accurate ways to make sure the cause of your pain is not dangerous. If your caregiver needs more information, he or she may order lab work or imaging tests (X-rays or MRIs).However, even if imaging tests show changes in your back, this usually does not require surgery. HOME CARE INSTRUCTIONS For many people, back pain returns.Since low back pain is rarely dangerous, it is often a condition that people can learn to [facility] their own.   Remain active. It is  stressful on the back to sit or stand in one place. Do not sit, drive, or stand in one place for more than 30 minutes at a time. Take short walks on level surfaces as soon as pain allows.Try to increase the length of time you walk each day.  Do not stay in bed.Resting more than 1 or 2 days can delay your recovery.  Do not avoid exercise or work.Your body is made to move.It is not dangerous to be active, even though your back may hurt.Your back will likely heal faster if you return to being active before your pain is gone.  Pay attention to your body when you bend and lift. Many people have less discomfortwhen lifting if they bend their knees, keep the load close to their bodies,and avoid twisting. Often, the most comfortable positions are those that put less stress on your recovering back.  Find a comfortable position to sleep. Use a firm mattress and  lie on your side with your knees slightly bent. If you lie on your back, put a pillow under your knees.  Only take over-the-counter or prescription medicines as directed by your caregiver. Over-the-counter medicines to reduce pain and inflammation are often the most helpful.Your caregiver may prescribe muscle relaxant drugs.These medicines help dull your pain so you can more quickly return to your normal activities and healthy exercise.  Put ice on the injured area.  Put ice in a plastic bag.  Place a towel between your skin and the bag.  Leave the ice on for 15-20 minutes, 03-04 times a day for the first 2 to 3 days. After that, ice and heat may be alternated to reduce pain and spasms.  Ask your caregiver about trying back exercises and gentle massage. This may be of some benefit.  Avoid feeling anxious or stressed.Stress increases muscle tension and can worsen back pain.It is important to recognize when you are anxious or stressed and learn ways to manage it.Exercise is a great option. SEEK MEDICAL CARE IF:  You have pain that is  not relieved with rest or medicine.  You have pain that does not improve in 1 week.  You have new symptoms.  You are generally not feeling well. SEEK IMMEDIATE MEDICAL CARE IF:   You have pain that radiates from your back into your legs.  You develop new bowel or bladder control problems.  You have unusual weakness or numbness in your arms or legs.  You develop nausea or vomiting.  You develop abdominal pain.  You feel faint. Document Released: 02/01/2005 Document Revised: 08/03/2011 Document Reviewed: 06/22/2010 [facility] Patient Information 2014 [place], [place].

## 2013-04-16 NOTE — ED Notes (Signed)
Pt states she fell on Friday while moving furniture. Pt states she has lower back pain. Pt has hx of sciatica and bulging discs. Pt ambulatory to exam room with steady gait.

## 2013-04-16 NOTE — ED Provider Notes (Signed)
CSN: [id number]     Arrival date & time 04/16/13  1608 History  This chart was scribed for non-physician practitioner Tracie Peliffany Jaleena Viviani, PA-C  working with Tracie SkeensJoshua M Zavitz, MD by Tracie Jimenez, ED Scribe. This patient was seen in room WTR5/WTR5 and the patient's care was started at 4:48 PM.    Chief Complaint  Patient presents with  . Fall  . Back Pain   The history is provided by the patient. No language interpreter was used.   HPI Comments: Tracie Jimenez is a 48 y.o. Female with a history of sciatica and bulging cervical discs who presents to the Emergency Department complaining of a fall that occurred 4 days ago when she reports that she slipped on ice while carrying some large furniture, causing it to fall on her left arm and she heard a "pop in the lower back. Patient is complaining of a constant, gradual onset pain to the lower back and left, upper arm secondary to the incident. She states that the pain is exacerbated with movement and laying flat. She denies new neck pain, bowel or bladder incontinence. Patient reports allergies to acetaminophen, codeine, and penicillins. Patient has a history of anxiety, depression, and bipolar I disorder.  Past Medical History  Diagnosis Date  . Anxiety   . Depression   . Bipolar 1 disorder    Past Surgical History  Procedure Laterality Date  . Appendectomy    . Tubal ligation     No family history on file. History  Substance Use Topics  . Smoking status: Current Every Day Smoker    Types: Cigarettes  . Smokeless tobacco: Not on file  . Alcohol Use: Yes   OB History   Grav Para Term Preterm Abortions TAB SAB Ect Mult Living                 Review of Systems  Genitourinary:       No incontinence  Musculoskeletal: Positive for back pain and myalgias. Negative for neck pain.  All other systems reviewed and are negative.   Allergies  Acetaminophen; Codeine; and Penicillins  Home Medications   Current Outpatient Rx  Name  Route  Sig   Dispense  Refill  . albuterol (PROVENTIL HFA;VENTOLIN HFA) 108 (90 BASE) MCG/ACT inhaler   Inhalation   Inhale 2 puffs into the lungs every 6 (six) hours as needed for wheezing. wheezing         . ALPRAZolam (XANAX) 0.5 MG tablet   Oral   Take 0.5 mg by mouth 2 (two) times daily as needed for sleep. Anxiety         . amitriptyline (ELAVIL) 150 MG tablet   Oral   Take 150 mg by mouth at bedtime.         . meloxicam (MOBIC) 7.5 MG tablet   Oral   Take 7.5 mg by mouth 2 (two) times daily as needed for pain.         Marland Kitchen. PARoxetine (PAXIL) 40 MG tablet   Oral   Take 60 mg by mouth daily.          . cyclobenzaprine (FLEXERIL) 10 MG tablet   Oral   Take 1 tablet (10 mg total) by mouth 2 (two) times daily as needed for muscle spasms.   20 tablet   0   . oxyCODONE (OXY IR/ROXICODONE) 5 MG immediate release tablet   Oral   Take 1 tablet (5 mg total) by mouth every 4 (four) hours as  needed for severe pain.   25 tablet   0    Triage Vitals: BP 109/68  Pulse 82  Temp(Src) 98.3 F (36.8 C) (Oral)  Resp 16  SpO2 97%  Physical Exam  Nursing note and vitals reviewed. Constitutional: She is oriented to person, place, and time. She appears well-developed and well-nourished. No distress.  HENT:  Head: Normocephalic and atraumatic.  Eyes: Conjunctivae are normal.  Neck: Normal range of motion. Neck supple.  Pulmonary/Chest: Effort normal. No respiratory distress.  Abdominal: She exhibits no distension.  Musculoskeletal: Normal range of motion.  Tenderness to the bilateral lumbar paraspinal muscles. No midline tenderness.   Neurological: She is alert and oriented to person, place, and time.  Neurovascularly intact in bilateral lower extremities. Good strength of bilateral lower extremities.   Skin: Skin is warm and dry.  Psychiatric: She has a normal mood and affect. Her behavior is normal.    ED Course  Procedures (including critical care time)  DIAGNOSTIC  STUDIES: Oxygen Saturation is 97% on room air, normal by my interpretation.    COORDINATION OF CARE: 4:55 PM- Will order an x-ray of the L spine. Will order prednisone and an injection of Dilaudid to manage symptoms. Discussed treatment plan with patient at bedside and patient verbalized agreement.     Labs Review Labs Reviewed - No data to display Imaging Review Dg Lumbar Spine Complete  04/16/2013   CLINICAL DATA:  Fall 3 days ago.  EXAM: LUMBAR SPINE - COMPLETE 4+ VIEW  COMPARISON:  08/08/2010  FINDINGS: Vertebral body alignment, heights and disc spaces are normal. There is mild spondylosis present. This no acute fracture or subluxation.  IMPRESSION: Mild spondylosis.  No acute findings.   Electronically Signed   By: Elberta Fortis M.D.   On: 04/16/2013 17:46     EKG Interpretation None      MDM   Final diagnoses:  Low back pain  Fall    Xray has come back without any acute abnormalities. Pt decided not to have me write her a prescription so she does not violate the pain management contract.  Pain treated in the ED.  48 y.o.Tracie Jimenez's  with back pain. No neurological deficits and normal neuro exam. Patient can walk but states is painful. No loss of bowel or bladder control. No concern for cauda equina. No fever, night sweats, weight loss, h/o cancer, IVDU. RICE protocol and pain medicine indicated and discussed with patient.   Patient Plan 1. Medications: narcotic pain medication, muscle relaxer and usual home medications  2. Treatment: rest, drink plenty of fluids, gentle stretching as discussed, alternate ice and heat  3. Follow Up: Please followup with your primary doctor for discussion of your diagnoses and further evaluation after today's visit; if you do not have a primary care doctor use the resource guide provided to find one   Vital signs are stable at discharge. Filed Vitals:   04/16/13 1828  BP: 108/69  Pulse: 65  Temp:   Resp: 16     Patient/guardian has voiced understanding and agreed to follow-up with the PCP or specialist.         Tracie Matas, PA-C 04/17/13 1919

## 2013-04-21 NOTE — ED Provider Notes (Signed)
Medical screening examination/treatment/procedure(s) were performed by non-physician practitioner and as supervising physician I was immediately available for consultation/collaboration.   EKG Interpretation None        Enid SkeensJoshua M Kamri Gotsch, MD 04/21/13 1606

## 2013-08-06 ENCOUNTER — Emergency Department (HOSPITAL_COMMUNITY)
Admission: EM | Admit: 2013-08-06 | Discharge: 2013-08-07 | Disposition: A | Discharge status: Home / Self Care | Payer: Medicaid Other | Attending: Emergency Medicine | Admitting: Emergency Medicine

## 2013-08-06 ENCOUNTER — Emergency Department (HOSPITAL_COMMUNITY): Payer: Medicaid Other

## 2013-08-06 ENCOUNTER — Encounter (HOSPITAL_COMMUNITY): Payer: Self-pay | Admitting: Emergency Medicine

## 2013-08-06 DIAGNOSIS — Z8659 Personal history of other mental and behavioral disorders: Secondary | ICD-10-CM | POA: Insufficient documentation

## 2013-08-06 DIAGNOSIS — Z79899 Other long term (current) drug therapy: Secondary | ICD-10-CM | POA: Insufficient documentation

## 2013-08-06 DIAGNOSIS — N3000 Acute cystitis without hematuria: Secondary | ICD-10-CM | POA: Insufficient documentation

## 2013-08-06 DIAGNOSIS — M549 Dorsalgia, unspecified: Secondary | ICD-10-CM | POA: Insufficient documentation

## 2013-08-06 DIAGNOSIS — F17200 Nicotine dependence, unspecified, uncomplicated: Secondary | ICD-10-CM | POA: Insufficient documentation

## 2013-08-06 DIAGNOSIS — F411 Generalized anxiety disorder: Secondary | ICD-10-CM | POA: Insufficient documentation

## 2013-08-06 DIAGNOSIS — R109 Unspecified abdominal pain: Secondary | ICD-10-CM | POA: Insufficient documentation

## 2013-08-06 DIAGNOSIS — F329 Major depressive disorder, single episode, unspecified: Secondary | ICD-10-CM | POA: Insufficient documentation

## 2013-08-06 DIAGNOSIS — F3289 Other specified depressive episodes: Secondary | ICD-10-CM | POA: Insufficient documentation

## 2013-08-06 DIAGNOSIS — Z880 Allergy status to penicillin: Secondary | ICD-10-CM | POA: Insufficient documentation

## 2013-08-06 LAB — URINALYSIS, ROUTINE W REFLEX MICROSCOPIC
Bilirubin Urine: NEGATIVE
Glucose, UA: NEGATIVE mg/dL
Ketones, ur: NEGATIVE mg/dL
NITRITE: POSITIVE — AB
PH: 6 (ref 5.0–8.0)
PROTEIN: NEGATIVE mg/dL
SPECIFIC GRAVITY, URINE: 1.012 (ref 1.005–1.030)
Urobilinogen, UA: 0.2 mg/dL (ref 0.0–1.0)

## 2013-08-06 LAB — I-STAT CHEM 8, ED
BUN: 4 mg/dL — ABNORMAL LOW (ref 6–23)
CHLORIDE: 100 meq/L (ref 96–112)
CREATININE: 0.7 mg/dL (ref 0.50–1.10)
Calcium, Ion: 1.19 mmol/L (ref 1.12–1.23)
Glucose, Bld: 93 mg/dL (ref 70–99)
HCT: 41 % (ref 36.0–46.0)
Hemoglobin: 13.9 g/dL (ref 12.0–15.0)
Potassium: 3.7 mEq/L (ref 3.7–5.3)
Sodium: 140 mEq/L (ref 137–147)
TCO2: 25 mmol/L (ref 0–100)

## 2013-08-06 LAB — PREGNANCY, URINE: PREG TEST UR: NEGATIVE

## 2013-08-06 LAB — URINE MICROSCOPIC-ADD ON

## 2013-08-06 MED ORDER — OXYCODONE HCL 5 MG PO TABS
10.0000 mg | ORAL_TABLET | Freq: Once | ORAL | Status: AC
  Administered 2013-08-06: 10 mg via ORAL
  Filled 2013-08-06: qty 2

## 2013-08-06 MED ORDER — KETOROLAC TROMETHAMINE 60 MG/2ML IM SOLN
60.0000 mg | Freq: Once | INTRAMUSCULAR | Status: AC
  Administered 2013-08-06: 60 mg via INTRAMUSCULAR
  Filled 2013-08-06: qty 2

## 2013-08-06 MED ORDER — OXYCODONE-ACETAMINOPHEN 5-325 MG PO TABS: 1.0000 | ORAL_TABLET | Freq: Once | ORAL | Status: DC

## 2013-08-06 NOTE — ED Notes (Signed)
Pt states she has pain to her left side when she coughs. Pt has a history of smoking and normally has a cough.

## 2013-08-06 NOTE — ED Notes (Signed)
Pt /o cough and sneezing x 5 days, states left side hurts and its making her back hurt worse which already hurts. Pt also states its making her anxiety worse. States she outside working one day and became really flushed and lightheaded

## 2013-08-07 MED ORDER — CEPHALEXIN 250 MG PO CAPS: 250.0000 mg | ORAL_CAPSULE | Freq: Once | ORAL | Status: DC

## 2013-08-07 MED ORDER — ONDANSETRON 8 MG PO TBDP: 8.0000 mg | ORAL_TABLET | Freq: Three times a day (TID) | ORAL | Status: DC | PRN

## 2013-08-07 MED ORDER — OXYCODONE HCL 5 MG PO TABS: 5.0000 mg | ORAL_TABLET | Freq: Four times a day (QID) | ORAL | Status: DC | PRN

## 2013-08-07 MED ORDER — CEPHALEXIN 500 MG PO CAPS
500.0000 mg | ORAL_CAPSULE | Freq: Once | ORAL | Status: DC
  Filled 2013-08-07: qty 1

## 2013-08-07 MED ORDER — CEPHALEXIN 500 MG PO CAPS
500.0000 mg | ORAL_CAPSULE | Freq: Once | ORAL | Status: AC
  Administered 2013-08-07: 500 mg via ORAL
  Filled 2013-08-07: qty 1

## 2013-08-07 MED ORDER — CEPHALEXIN 500 MG PO CAPS: 500.0000 mg | ORAL_CAPSULE | Freq: Three times a day (TID) | ORAL | Status: DC

## 2013-08-07 NOTE — ED Provider Notes (Signed)
CSN: [id number]     Arrival date & time 08/06/13  1703 History   First MD Initiated Contact with Patient 08/06/13 2117     Chief Complaint  Patient presents with  . Cough  . Back Pain  . Flank Pain    left      The history is provided by the patient.   patient reports ongoing left-sided abdominal and left flank pain over the past several days.  She initially had nausea and vomiting but reports that is improved.  No fevers or chills.  History kidney stones.  She does feel like she has some discomfort in her left lower quadrant.  Denies dysuria or urinary frequency.  No diarrhea.  No fevers or chills.  No history of similar symptoms except for one time when she was diagnosed with a left ovarian cyst.  No other complaints at this time.  Symptoms are mild to moderate in severity.  Past Medical History  Diagnosis Date  . Anxiety   . Depression   . Bipolar 1 disorder    Past Surgical History  Procedure Laterality Date  . Appendectomy    . Tubal ligation     No family history on file. History  Substance Use Topics  . Smoking status: Current Every Day Smoker    Types: Cigarettes  . Smokeless tobacco: Not on file  . Alcohol Use: Yes   OB History   Grav Para Term Preterm Abortions TAB SAB Ect Mult Living                 Review of Systems  Respiratory: Positive for cough.   Genitourinary: Positive for flank pain.  Musculoskeletal: Positive for back pain.  All other systems reviewed and are negative.     Allergies  Acetaminophen; Codeine; and Penicillins  Home Medications   Prior to Admission medications   Medication Sig Start Date End Date Taking? Authorizing Provider  albuterol (PROVENTIL HFA;VENTOLIN HFA) 108 (90 BASE) MCG/ACT inhaler Inhale 2 puffs into the lungs every 6 (six) hours as needed for wheezing. wheezing   Yes Historical Provider, MD  ALPRAZolam (XANAX) 0.5 MG tablet Take 0.5 mg by mouth 2 (two) times daily as needed for sleep. Anxiety   Yes Historical  Provider, MD  amitriptyline (ELAVIL) 150 MG tablet Take 150 mg by mouth at bedtime.   Yes Historical Provider, MD  PARoxetine (PAXIL) 40 MG tablet Take 60 mg by mouth daily.    Yes Historical Provider, MD  cyclobenzaprine (FLEXERIL) 10 MG tablet Take 1 tablet (10 mg total) by mouth 2 (two) times daily as needed for muscle spasms. 04/16/13   Tiffany Irine SealG Greene, PA-C  meloxicam (MOBIC) 7.5 MG tablet Take 7.5 mg by mouth 2 (two) times daily as needed for pain.    Historical Provider, MD   BP 119/76  Pulse 70  Temp(Src) 98.1 F (36.7 C) (Oral)  Resp 16  SpO2 100%  LMP 08/02/2013 Physical Exam  Nursing note and vitals reviewed. Constitutional: She is oriented to person, place, and time. She appears well-developed and well-nourished. No distress.  HENT:  Head: Normocephalic and atraumatic.  Eyes: EOM are normal.  Neck: Normal range of motion.  Cardiovascular: Normal rate, regular rhythm and normal heart sounds.   Pulmonary/Chest: Effort normal and breath sounds normal.  Abdominal: Soft. She exhibits no distension.  Mild left lower quadrant tenderness without guarding or rebound  Genitourinary:  Mild left CVA tenderness  Musculoskeletal: Normal range of motion.  Neurological: She is alert and  oriented to person, place, and time.  Skin: Skin is warm and dry.  Psychiatric: She has a normal mood and affect. Judgment normal.    ED Course  Procedures (including critical care time) Labs Review Labs Reviewed  URINALYSIS, ROUTINE W REFLEX MICROSCOPIC - Abnormal; Notable for the following:    APPearance CLOUDY (*)    Hgb urine dipstick TRACE (*)    Nitrite POSITIVE (*)    Leukocytes, UA TRACE (*)    All other components within normal limits  URINE MICROSCOPIC-ADD ON - Abnormal; Notable for the following:    Bacteria, UA MANY (*)    All other components within normal limits  I-STAT CHEM 8, ED - Abnormal; Notable for the following:    BUN 4 (*)    All other components within normal limits   URINE CULTURE  PREGNANCY, URINE    Imaging Review Ct Abdomen Pelvis Wo Contrast  08/07/2013   CLINICAL DATA:  Left lower quadrant and left flank pain.  EXAM: CT ABDOMEN AND PELVIS WITHOUT CONTRAST  TECHNIQUE: Multidetector CT imaging of the abdomen and pelvis was performed following the standard protocol without IV contrast.  COMPARISON:  Lumbar spine radiographs, 04/16/2013  FINDINGS: The noncontrast CT appearance of the liver, spleen, pancreas, and adrenal glands is within normal limits. No specific gallbladder or biliary abnormality identified. Kidneys and proximal ureters unremarkable. Urinary bladder is empty. There is calcification right at the left iliac artery cross over which appears to be vascular. I doubt that this is in the ureter is specially on the basis of the lack of hydroureter proximal to this level.  Uterine and adnexal contours unremarkable. There is an air-fluid level in the rectum. Tubal ligation clips noted. Appendix absent.  IMPRESSION: 1. Distal colonic and rectal air-fluid levels compatible with diarrheal process. 2. There is calcification in the immediate vicinity of the left ureter at the iliac vessel cross over. However there is no hydroureter proximal to this and based on the morphology the calcification I suspect that this is a vascular calcification and not a ureteral calcification.   Electronically Signed   By: Herbie BaltimoreWalt  Liebkemann M.D.   On: 08/07/2013 00:01     EKG Interpretation None      MDM   Final diagnoses:  None    Suspect the patient is developing left-sided pyelonephritis.  Home with a short course pain medicine and antibiotics.  She understands return to the ER for new or worsening symptoms    Lyanne CoKevin M Campos, MD 08/07/13 0005

## 2013-08-07 NOTE — Discharge Instructions (Signed)

## 2013-08-08 LAB — URINE CULTURE

## 2013-08-09 ENCOUNTER — Telehealth (HOSPITAL_COMMUNITY): Payer: Self-pay

## 2013-08-09 NOTE — ED Notes (Signed)
Post ED Visit - Positive Culture Follow-up  Culture report reviewed by antimicrobial stewardship pharmacist: []  Wes Dulaney, Pharm.D., BCPS []  Celedonio MiyamotoJeremy Frens, Pharm.D., BCPS []  Georgina PillionElizabeth Martin, [address]Pharm[address]D., BCPS [x]  Clifton SpringsMinh Pham, [place]Pharm.D., BCPS, AAHIVP []  Estella HuskMichelle Turner, Pharm.D., BCPS, AAHIVP []  Harvie JuniorNathan Cope, Pharm.D.  Positive urine culture Treated with Cephalexin, organism sensitive to the same and no further patient follow-up is required at this time.  Ashley JacobsFesterman, Toni C 08/09/2013, 11:24 AM

## 2013-09-27 ENCOUNTER — Emergency Department (HOSPITAL_COMMUNITY): Payer: Medicaid Other

## 2013-09-27 ENCOUNTER — Encounter (HOSPITAL_COMMUNITY): Payer: Self-pay | Admitting: Emergency Medicine

## 2013-09-27 ENCOUNTER — Emergency Department (HOSPITAL_COMMUNITY)
Admission: EM | Admit: 2013-09-27 | Discharge: 2013-09-27 | Disposition: A | Discharge status: Home / Self Care | Payer: Medicaid Other | Attending: Emergency Medicine | Admitting: Emergency Medicine

## 2013-09-27 DIAGNOSIS — M940 Chondrocostal junction syndrome [Tietze]: Secondary | ICD-10-CM | POA: Diagnosis not present

## 2013-09-27 DIAGNOSIS — Z8739 Personal history of other diseases of the musculoskeletal system and connective tissue: Secondary | ICD-10-CM | POA: Diagnosis not present

## 2013-09-27 DIAGNOSIS — N3000 Acute cystitis without hematuria: Secondary | ICD-10-CM | POA: Insufficient documentation

## 2013-09-27 DIAGNOSIS — R109 Unspecified abdominal pain: Secondary | ICD-10-CM | POA: Diagnosis not present

## 2013-09-27 DIAGNOSIS — Z880 Allergy status to penicillin: Secondary | ICD-10-CM | POA: Diagnosis not present

## 2013-09-27 DIAGNOSIS — Z3202 Encounter for pregnancy test, result negative: Secondary | ICD-10-CM | POA: Insufficient documentation

## 2013-09-27 DIAGNOSIS — F411 Generalized anxiety disorder: Secondary | ICD-10-CM | POA: Insufficient documentation

## 2013-09-27 DIAGNOSIS — J209 Acute bronchitis, unspecified: Secondary | ICD-10-CM | POA: Diagnosis not present

## 2013-09-27 DIAGNOSIS — R079 Chest pain, unspecified: Secondary | ICD-10-CM | POA: Insufficient documentation

## 2013-09-27 DIAGNOSIS — F3289 Other specified depressive episodes: Secondary | ICD-10-CM | POA: Insufficient documentation

## 2013-09-27 DIAGNOSIS — F17200 Nicotine dependence, unspecified, uncomplicated: Secondary | ICD-10-CM | POA: Insufficient documentation

## 2013-09-27 DIAGNOSIS — F329 Major depressive disorder, single episode, unspecified: Secondary | ICD-10-CM | POA: Insufficient documentation

## 2013-09-27 DIAGNOSIS — Z79899 Other long term (current) drug therapy: Secondary | ICD-10-CM | POA: Insufficient documentation

## 2013-09-27 DIAGNOSIS — J40 Bronchitis, not specified as acute or chronic: Secondary | ICD-10-CM

## 2013-09-27 HISTORY — DX: Reserved for concepts with insufficient information to code with codable children: IMO0002

## 2013-09-27 LAB — POC URINE PREG, ED: PREG TEST UR: NEGATIVE

## 2013-09-27 LAB — CBC
HCT: 38.9 % (ref 36.0–46.0)
Hemoglobin: 13.7 g/dL (ref 12.0–15.0)
MCH: 32.1 pg (ref 26.0–34.0)
MCHC: 35.2 g/dL (ref 30.0–36.0)
MCV: 91.1 fL (ref 78.0–100.0)
PLATELETS: 262 10*3/uL (ref 150–400)
RBC: 4.27 MIL/uL (ref 3.87–5.11)
RDW: 11.9 % (ref 11.5–15.5)
WBC: 5.4 10*3/uL (ref 4.0–10.5)

## 2013-09-27 LAB — URINALYSIS, ROUTINE W REFLEX MICROSCOPIC
Bilirubin Urine: NEGATIVE
Glucose, UA: NEGATIVE mg/dL
Ketones, ur: NEGATIVE mg/dL
Nitrite: POSITIVE — AB
PROTEIN: NEGATIVE mg/dL
SPECIFIC GRAVITY, URINE: 1.023 (ref 1.005–1.030)
UROBILINOGEN UA: 1 mg/dL (ref 0.0–1.0)
pH: 6 (ref 5.0–8.0)

## 2013-09-27 LAB — COMPREHENSIVE METABOLIC PANEL
ALT: 9 U/L (ref 0–35)
AST: 14 U/L (ref 0–37)
Albumin: 4.1 g/dL (ref 3.5–5.2)
Alkaline Phosphatase: 84 U/L (ref 39–117)
Anion gap: 11 (ref 5–15)
BUN: 11 mg/dL (ref 6–23)
CALCIUM: 9.5 mg/dL (ref 8.4–10.5)
CO2: 27 mEq/L (ref 19–32)
Chloride: 102 mEq/L (ref 96–112)
Creatinine, Ser: 0.83 mg/dL (ref 0.50–1.10)
GFR calc Af Amer: >90 mL/min (ref >90)
GFR calc non Af Amer: 83 mL/min — ABNORMAL LOW (ref >90)
Glucose, Bld: 93 mg/dL (ref 70–99)
Potassium: 4.5 mEq/L (ref 3.7–5.3)
Sodium: 140 mEq/L (ref 137–147)
Total Bilirubin: 0.5 mg/dL (ref 0.3–1.2)
Total Protein: 7.6 g/dL (ref 6.0–8.3)

## 2013-09-27 LAB — I-STAT TROPONIN, ED: TROPONIN I, POC: 0 ng/mL (ref 0.00–0.08)

## 2013-09-27 LAB — URINE MICROSCOPIC-ADD ON

## 2013-09-27 MED ORDER — NAPROXEN 500 MG PO TABS: 500.0000 mg | ORAL_TABLET | Freq: Two times a day (BID) | ORAL | Status: DC

## 2013-09-27 MED ORDER — PHENAZOPYRIDINE HCL 200 MG PO TABS: 200.0000 mg | ORAL_TABLET | Freq: Three times a day (TID) | ORAL | Status: DC | PRN

## 2013-09-27 MED ORDER — LEVOFLOXACIN 500 MG PO TABS: 500.0000 mg | ORAL_TABLET | Freq: Every day | ORAL | Status: DC

## 2013-09-27 MED ORDER — ONDANSETRON HCL 4 MG PO TABS: 4.0000 mg | ORAL_TABLET | Freq: Three times a day (TID) | ORAL | Status: DC | PRN

## 2013-09-27 MED ORDER — SODIUM CHLORIDE 0.9 % IV SOLN
INTRAVENOUS | Status: DC
Start: 2013-09-27 — End: 2013-09-28
  Administered 2013-09-27: 20:00:00 via INTRAVENOUS

## 2013-09-27 MED ORDER — CYCLOBENZAPRINE HCL 10 MG PO TABS: 10.0000 mg | ORAL_TABLET | Freq: Three times a day (TID) | ORAL | Status: DC | PRN

## 2013-09-27 MED ORDER — ONDANSETRON HCL 4 MG/2ML IJ SOLN
4.0000 mg | Freq: Once | INTRAMUSCULAR | Status: AC
  Administered 2013-09-27: 4 mg via INTRAVENOUS
  Filled 2013-09-27: qty 2

## 2013-09-27 MED ORDER — FENTANYL CITRATE 0.05 MG/ML IJ SOLN
50.0000 ug | Freq: Once | INTRAMUSCULAR | Status: AC
  Administered 2013-09-27: 50 ug via INTRAVENOUS
  Filled 2013-09-27: qty 2

## 2013-09-27 MED ORDER — LEVOFLOXACIN IN D5W 750 MG/150ML IV SOLN
750.0000 mg | Freq: Once | INTRAVENOUS | Status: AC
  Administered 2013-09-27: 750 mg via INTRAVENOUS
  Filled 2013-09-27: qty 150

## 2013-09-27 NOTE — Discharge Instructions (Signed)
Drink plenty of fluids. Use the zofran if needed for nausea. Take the antibiotic until gone. Try to stop smoking! You can take mucinex DM OTC for cough if needed. Take the naproxen and flexeril for your sore muscles. Take the pyridium for bladder pain, wear a pad or old underwear because it will stain if you have leaking. Consider seeing a urologist for further testing for your frequent UTI's. STOP SMOKING!  Use ice and heat on your chest for the pain. The naproxen and flexeril will also help with your costochondritis.

## 2013-09-27 NOTE — ED Provider Notes (Signed)
CSN: [id number]     Arrival date & time 09/27/13  1603 History   First MD Initiated Contact with Patient 09/27/13 1656     Chief Complaint  Patient presents with  . Chest Pain  . Abdominal Pain  . Flank Pain     (Consider location/radiation/quality/duration/timing/severity/associated sxs/prior Treatment) HPI Patient reports about a week ago she started getting chest pain. She indicates the pain is sharp and is located in the center of her chest. The pain comes and goes and lasts about 2-3 minutes. She states increased movement, being overly active, or being stressed makes the pain worse. She also has had some shortness of breath and states that walking and being busy makes the shortness of breath worse. She has had a cough with some intermittent clear mucous. Sometimes it is yellow. She states she feels cold but no documented fever or chills. She also states she is having lower abdominal pain "again" today that  she had on June 22 when she was diagnosed with a  UTI. She states she's having bilateral flank pain but the left is worse than the right. She denies dysuria but does have frequency because she is drinking a lot of fluids.  PCP none in 2 years Psychiatry Dr Nila Nephew at [facility]  Past Medical History  Diagnosis Date  . Anxiety   . Depression   . Bipolar 1 disorder   . Bulging discs    Past Surgical History  Procedure Laterality Date  . Appendectomy    . Tubal ligation     No family history on file. History  Substance Use Topics  . Smoking status: Current Every Day Smoker    Types: Cigarettes  . Smokeless tobacco: Not on file  . Alcohol Use: Yes     Comment: occasionally    Employed Smokes 1/3 ppd  Drinks every other week  OB History   Grav Para Term Preterm Abortions TAB SAB Ect Mult Living                 Review of Systems  All other systems reviewed and are negative.     Allergies  Acetaminophen; Codeine; and Penicillins  Home Medications   Prior to  Admission medications   Medication Sig Start Date End Date Taking? Authorizing Provider  albuterol (PROVENTIL HFA;VENTOLIN HFA) 108 (90 BASE) MCG/ACT inhaler Inhale 2 puffs into the lungs every 6 (six) hours as needed for wheezing. wheezing   Yes Historical Provider, MD  ALPRAZolam (XANAX) 0.5 MG tablet Take 0.5 mg by mouth 2 (two) times daily as needed for sleep. Anxiety   Yes Historical Provider, MD  amitriptyline (ELAVIL) 150 MG tablet Take 150 mg by mouth at bedtime.   Yes Historical Provider, MD  ondansetron (ZOFRAN ODT) 8 MG disintegrating tablet Take 1 tablet (8 mg total) by mouth every 8 (eight) hours as needed for nausea or vomiting. 08/07/13  Yes Lyanne Co, MD  oxyCODONE (ROXICODONE) 5 MG immediate release tablet Take 1 tablet (5 mg total) by mouth every 6 (six) hours as needed for severe pain. 08/07/13  Yes Lyanne Co, MD  PARoxetine (PAXIL) 40 MG tablet Take 60 mg by mouth daily.    Yes Historical Provider, MD  cyclobenzaprine (FLEXERIL) 10 MG tablet Take 1 tablet (10 mg total) by mouth 3 (three) times daily as needed (painful muscles). 09/27/13   Ward Givens, MD  levofloxacin (LEVAQUIN) 500 MG tablet Take 1 tablet (500 mg total) by mouth daily. 09/27/13   Fartun Paradiso  Hildred Laser, MD  naproxen (NAPROSYN) 500 MG tablet Take 1 tablet (500 mg total) by mouth 2 (two) times daily with a meal. 09/27/13   Ward Givens, MD  ondansetron (ZOFRAN) 4 MG tablet Take 1 tablet (4 mg total) by mouth every 8 (eight) hours as needed for nausea or vomiting. 09/27/13   Ward Givens, MD  phenazopyridine (PYRIDIUM) 200 MG tablet Take 1 tablet (200 mg total) by mouth 3 (three) times daily as needed (painful bladder). 09/27/13   Ward Givens, MD   BP 112/66  Pulse 66  Temp(Src) 98.2 F (36.8 C) (Oral)  Resp 16  SpO2 100%  LMP 08/28/2013  Vital signs normal   Physical Exam  Nursing note and vitals reviewed. Constitutional: She is oriented to person, place, and time. She appears well-developed and well-nourished.   Non-toxic appearance. She does not appear ill. No distress.  HENT:  Head: Normocephalic and atraumatic.  Right Ear: External ear normal.  Left Ear: External ear normal.  Nose: Nose normal. No mucosal edema or rhinorrhea.  Mouth/Throat: Oropharynx is clear and moist and mucous membranes are normal. No dental abscesses or uvula swelling.  Eyes: Conjunctivae and EOM are normal. Pupils are equal, round, and reactive to light.  Neck: Normal range of motion and full passive range of motion without pain. Neck supple.  Cardiovascular: Normal rate, regular rhythm and normal heart sounds.  Exam reveals no gallop and no friction rub.   No murmur heard. Pulmonary/Chest: Effort normal and breath sounds normal. No respiratory distress. She has no wheezes. She has no rhonchi. She has no rales. She exhibits no tenderness and no crepitus.  Abdominal: Soft. Normal appearance and bowel sounds are normal. She exhibits no distension. There is tenderness. There is no rebound and no guarding.    Genitourinary:  bilateral CVAT, left worse than right  Musculoskeletal: Normal range of motion. She exhibits no edema and no tenderness.  Moves all extremities well.   Neurological: She is alert and oriented to person, place, and time. She has normal strength. No cranial nerve deficit.  Skin: Skin is warm, dry and intact. No rash noted. No erythema. No pallor.  Psychiatric: She has a normal mood and affect. Her speech is normal and behavior is normal. Her mood appears not anxious.    ED Course  Procedures (including critical care time)  Medications  0.9 %  sodium chloride infusion ( Intravenous New Bag/Given 09/27/13 1933)  levofloxacin (LEVAQUIN) IVPB 750 mg (750 mg Intravenous New Bag/Given 09/27/13 1945)  fentaNYL (SUBLIMAZE) injection 50 mcg (50 mcg Intravenous Given 09/27/13 1934)  ondansetron (ZOFRAN) injection 4 mg (4 mg Intravenous Given 09/27/13 1934)    Review of prior charts show she had a urine culture  done on June 22 that grew over 100,000 colonies of Escherichia coli that was sensitive to all antibiotics tested. Patient states she was treated with Cipro.  Patient has chest wall pain most likely costochondritis. We discussed treatment of that.  Pt started on levaquin which will treat both respiratory and urinary infections. CT scan done to r/o renal stone as nidus for recurrent UTI and her flank pain..   Labs Review Results for orders placed during the hospital encounter of 09/27/13  CBC      Result Value Ref Range   WBC 5.4  4.0 - 10.5 K/uL   RBC 4.27  3.87 - 5.11 MIL/uL   Hemoglobin 13.7  12.0 - 15.0 g/dL   HCT 16.1  09.6 - 04.5 %  MCV 91.1  78.0 - 100.0 fL   MCH 32.1  26.0 - 34.0 pg   MCHC 35.2  30.0 - 36.0 g/dL   RDW 40.9  81.1 - 91.4 %   Platelets 262  150 - 400 K/uL  COMPREHENSIVE METABOLIC PANEL      Result Value Ref Range   Sodium 140  137 - 147 mEq/L   Potassium 4.5  3.7 - 5.3 mEq/L   Chloride 102  96 - 112 mEq/L   CO2 27  19 - 32 mEq/L   Glucose, Bld 93  70 - 99 mg/dL   BUN 11  6 - 23 mg/dL   Creatinine, Ser 7.82  0.50 - 1.10 mg/dL   Calcium 9.5  8.4 - 95.6 mg/dL   Total Protein 7.6  6.0 - 8.3 g/dL   Albumin 4.1  3.5 - 5.2 g/dL   AST 14  0 - 37 U/L   ALT 9  0 - 35 U/L   Alkaline Phosphatase 84  39 - 117 U/L   Total Bilirubin 0.5  0.3 - 1.2 mg/dL   GFR calc non Af Amer 83 (*) >90 mL/min   GFR calc Af Amer >90  >90 mL/min   Anion gap 11  5 - 15  URINALYSIS, ROUTINE W REFLEX MICROSCOPIC      Result Value Ref Range   Color, Urine YELLOW  YELLOW   APPearance CLOUDY (*) CLEAR   Specific Gravity, Urine 1.023  1.005 - 1.030   pH 6.0  5.0 - 8.0   Glucose, UA NEGATIVE  NEGATIVE mg/dL   Hgb urine dipstick SMALL (*) NEGATIVE   Bilirubin Urine NEGATIVE  NEGATIVE   Ketones, ur NEGATIVE  NEGATIVE mg/dL   Protein, ur NEGATIVE  NEGATIVE mg/dL   Urobilinogen, UA 1.0  0.0 - 1.0 mg/dL   Nitrite POSITIVE (*) NEGATIVE   Leukocytes, UA LARGE (*) NEGATIVE  URINE  MICROSCOPIC-ADD ON      Result Value Ref Range   Squamous Epithelial / LPF RARE  RARE   WBC, UA 21-50  <3 WBC/hpf   RBC / HPF 7-10  <3 RBC/hpf   Bacteria, UA MANY (*) RARE  I-STAT TROPOININ, ED      Result Value Ref Range   Troponin i, poc 0.00  0.00 - 0.08 ng/mL   Comment 3           POC URINE PREG, ED      Result Value Ref Range   Preg Test, Ur NEGATIVE  NEGATIVE   Laboratory interpretation all normal except UTI   Imaging Review Ct Abdomen Pelvis Wo Contrast  09/27/2013   CLINICAL DATA:  Bilateral flank pain  EXAM: CT ABDOMEN AND PELVIS WITHOUT CONTRAST  TECHNIQUE: Multidetector CT imaging of the abdomen and pelvis was performed following the standard protocol without IV contrast.  COMPARISON:  08/06/2013  FINDINGS: The lung bases are clear.  No renal, ureteral, or bladder calculi. No obstructive uropathy. No perinephric stranding is seen. The kidneys are symmetric in size without evidence for exophytic mass. The bladder is unremarkable.  The liver demonstrates no focal abnormality. The gallbladder is unremarkable. The spleen demonstrates no focal abnormality. The adrenal glands and pancreas are normal.  The unopacified stomach, duodenum, small intestine and large intestine are unremarkable, but evaluation is limited by lack of oral contrast. There is no pneumoperitoneum, pneumatosis, or portal venous gas. There is no abdominal or pelvic free fluid. There is no lymphadenopathy.  The abdominal aorta is normal in caliber.  The osseous structures are unremarkable.  IMPRESSION: No urolithiasis or obstructive uropathy.   Electronically Signed   By: Elige KoHetal  Patel   On: 09/27/2013 20:04   Dg Chest 2 View  09/27/2013   CLINICAL DATA:  One week history of chest pain, shortness of breath, and cough; history of tobacco use.  EXAM: CHEST  2 VIEW  COMPARISON:  PA and lateral chest x-ray of March 07, 2012  FINDINGS: The lungs remain mildly hyperinflated. There is no focal infiltrate. The heart and  pulmonary vascularity are normal. There is no pleural nor pericardial effusion nor pneumothorax. There is stable apical pleural thickening bilaterally. The bony thorax is unremarkable.  IMPRESSION: There is no evidence of pneumonia nor CHF. There are stable findings consistent with COPD or reactive airway disease.   Electronically Signed   By: David  SwazilandJordan   On: 09/27/2013 16:30     EKG Interpretation   Date/Time:  Thursday September 27 2013 16:09:46 EDT Ventricular Rate:  84 PR Interval:  161 QRS Duration: 86 QT Interval:  391 QTC Calculation: 462 R Axis:   83 Text Interpretation:  Normal sinus rhythm Normal ECG No old tracing to  compare Confirmed by KOHUT  MD, STEPHEN ([id number]) on 09/27/2013 4:15:00 PM      MDM   Final diagnoses:  Bilateral flank pain  Acute cystitis without hematuria  Costochondritis, acute  Bronchitis    New Prescriptions   CYCLOBENZAPRINE (FLEXERIL) 10 MG TABLET    Take 1 tablet (10 mg total) by mouth 3 (three) times daily as needed (painful muscles).   LEVOFLOXACIN (LEVAQUIN) 500 MG TABLET    Take 1 tablet (500 mg total) by mouth daily.   NAPROXEN (NAPROSYN) 500 MG TABLET    Take 1 tablet (500 mg total) by mouth 2 (two) times daily with a meal.   ONDANSETRON (ZOFRAN) 4 MG TABLET    Take 1 tablet (4 mg total) by mouth every 8 (eight) hours as needed for nausea or vomiting.   PHENAZOPYRIDINE (PYRIDIUM) 200 MG TABLET    Take 1 tablet (200 mg total) by mouth 3 (three) times daily as needed (painful bladder).    Plan discharge  Devoria AlbeIva Ashan Cueva, MD, Franz DellFACEP     Daniele Dillow L Telisha Zawadzki, MD 09/27/13 2123

## 2013-09-27 NOTE — ED Notes (Signed)
Pt presents with c/o mid chest pain, abdominal pain, and bilateral flank pain. Pt says that the chest pain started approx one week ago, c/o associated shortness of breath. Pt is 100% on RA at this time, talking in complete sentences, NAD. Pt also c/o abdominal pain with some nausea, no vomiting. Pt also says that she also has bilateral flank pain, no urinary symptoms.

## 2013-09-30 LAB — URINE CULTURE
Colony Count: >=100000
SPECIAL REQUESTS: NORMAL

## 2013-10-01 ENCOUNTER — Telehealth (HOSPITAL_BASED_OUTPATIENT_CLINIC_OR_DEPARTMENT_OTHER): Payer: Self-pay | Admitting: Emergency Medicine

## 2013-10-01 NOTE — Telephone Encounter (Signed)
Post ED Visit - Positive Culture Follow-up  Culture report reviewed by antimicrobial stewardship pharmacist: []  Wes Dulaney, Pharm.D., BCPS []  Celedonio MiyamotoJeremy Frens, [address] BoulevardPharm.D., BCPS []  Georgina PillionElizabeth Martin, Pharm.D., BCPS []  StoverMinh Pham, [address] BoulevardPharm.D., BCPS, AAHIVP []  Estella HuskMichelle Turner, Pharm.D., BCPS, AAHIVP [x]  Red ChristiansSamson Lee, Pharm.D. []  Tennis Mustassie Stewart, Pharm.D.  Positive urine culture >100,000 colonies/ml E. Coli Treated with Levofloxacin 500 mg po tabs, one tablet by mouth daily x 10 days, organism sensitive to the same and no further patient follow-up is required at this time.  Berle MullMiller, Neila Teem 10/01/2013, 1:31 PM

## 2014-01-24 ENCOUNTER — Encounter (HOSPITAL_COMMUNITY): Payer: Self-pay | Admitting: Vascular Surgery

## 2014-01-24 ENCOUNTER — Emergency Department (HOSPITAL_COMMUNITY)
Admission: EM | Admit: 2014-01-24 | Discharge: 2014-01-24 | Disposition: A | Discharge status: Home / Self Care | Payer: Medicaid Other | Attending: Emergency Medicine | Admitting: Emergency Medicine

## 2014-01-24 ENCOUNTER — Emergency Department (HOSPITAL_COMMUNITY): Payer: Self-pay

## 2014-01-24 ENCOUNTER — Emergency Department (HOSPITAL_COMMUNITY): Payer: Medicaid Other

## 2014-01-24 DIAGNOSIS — R112 Nausea with vomiting, unspecified: Secondary | ICD-10-CM | POA: Insufficient documentation

## 2014-01-24 DIAGNOSIS — R519 Headache, unspecified: Secondary | ICD-10-CM

## 2014-01-24 DIAGNOSIS — Z720 Tobacco use: Secondary | ICD-10-CM | POA: Insufficient documentation

## 2014-01-24 DIAGNOSIS — R1110 Vomiting, unspecified: Secondary | ICD-10-CM

## 2014-01-24 DIAGNOSIS — F419 Anxiety disorder, unspecified: Secondary | ICD-10-CM | POA: Insufficient documentation

## 2014-01-24 DIAGNOSIS — R05 Cough: Secondary | ICD-10-CM | POA: Insufficient documentation

## 2014-01-24 DIAGNOSIS — Z791 Long term (current) use of non-steroidal anti-inflammatories (NSAID): Secondary | ICD-10-CM | POA: Insufficient documentation

## 2014-01-24 DIAGNOSIS — R51 Headache: Secondary | ICD-10-CM | POA: Insufficient documentation

## 2014-01-24 DIAGNOSIS — F319 Bipolar disorder, unspecified: Secondary | ICD-10-CM | POA: Insufficient documentation

## 2014-01-24 DIAGNOSIS — R109 Unspecified abdominal pain: Secondary | ICD-10-CM | POA: Insufficient documentation

## 2014-01-24 DIAGNOSIS — M549 Dorsalgia, unspecified: Secondary | ICD-10-CM | POA: Insufficient documentation

## 2014-01-24 DIAGNOSIS — Z880 Allergy status to penicillin: Secondary | ICD-10-CM | POA: Insufficient documentation

## 2014-01-24 DIAGNOSIS — Z79899 Other long term (current) drug therapy: Secondary | ICD-10-CM | POA: Insufficient documentation

## 2014-01-24 DIAGNOSIS — R0789 Other chest pain: Secondary | ICD-10-CM | POA: Insufficient documentation

## 2014-01-24 LAB — COMPREHENSIVE METABOLIC PANEL
ALBUMIN: 4.1 g/dL (ref 3.5–5.2)
ALT: 9 U/L (ref 0–35)
ANION GAP: 9 (ref 5–15)
AST: 14 U/L (ref 0–37)
Alkaline Phosphatase: 77 U/L (ref 39–117)
BUN: 8 mg/dL (ref 6–23)
CO2: 32 mEq/L (ref 19–32)
CREATININE: 0.65 mg/dL (ref 0.50–1.10)
Calcium: 10.1 mg/dL (ref 8.4–10.5)
Chloride: 96 mEq/L (ref 96–112)
GFR calc Af Amer: >90 mL/min (ref >90)
GFR calc non Af Amer: >90 mL/min (ref >90)
Glucose, Bld: 102 mg/dL — ABNORMAL HIGH (ref 70–99)
Potassium: 4.3 mEq/L (ref 3.7–5.3)
Sodium: 137 mEq/L (ref 137–147)
TOTAL PROTEIN: 7 g/dL (ref 6.0–8.3)
Total Bilirubin: 0.5 mg/dL (ref 0.3–1.2)

## 2014-01-24 LAB — I-STAT TROPONIN, ED: TROPONIN I, POC: 0 ng/mL (ref 0.00–0.08)

## 2014-01-24 LAB — CBC
HCT: 38.2 % (ref 36.0–46.0)
Hemoglobin: 13.3 g/dL (ref 12.0–15.0)
MCH: 31.1 pg (ref 26.0–34.0)
MCHC: 34.8 g/dL (ref 30.0–36.0)
MCV: 89.5 fL (ref 78.0–100.0)
PLATELETS: 208 10*3/uL (ref 150–400)
RBC: 4.27 MIL/uL (ref 3.87–5.11)
RDW: 11.9 % (ref 11.5–15.5)
WBC: 5.8 10*3/uL (ref 4.0–10.5)

## 2014-01-24 LAB — TSH: TSH: 2.14 u[IU]/mL (ref 0.350–4.500)

## 2014-01-24 MED ORDER — SODIUM CHLORIDE 0.9 % IV BOLUS (SEPSIS)
1000.0000 mL | Freq: Once | INTRAVENOUS | Status: AC
  Administered 2014-01-24: 1000 mL via INTRAVENOUS

## 2014-01-24 MED ORDER — RANITIDINE HCL 150 MG PO TABS: 150.0000 mg | ORAL_TABLET | Freq: Two times a day (BID) | ORAL | Status: DC

## 2014-01-24 MED ORDER — ONDANSETRON HCL 4 MG PO TABS: 4.0000 mg | ORAL_TABLET | Freq: Four times a day (QID) | ORAL | Status: DC

## 2014-01-24 MED ORDER — DIPHENHYDRAMINE HCL 50 MG/ML IJ SOLN
25.0000 mg | Freq: Once | INTRAMUSCULAR | Status: AC
  Administered 2014-01-24: 25 mg via INTRAVENOUS
  Filled 2014-01-24: qty 1

## 2014-01-24 MED ORDER — METOCLOPRAMIDE HCL 5 MG/ML IJ SOLN
10.0000 mg | Freq: Once | INTRAMUSCULAR | Status: AC
  Administered 2014-01-24: 10 mg via INTRAVENOUS
  Filled 2014-01-24: qty 2

## 2014-01-24 NOTE — ED Notes (Signed)
Pt to the ED for eval of chest pain intermittently x 1 month. Pt reports associated symptoms of SOB, dizziness, N/V, and lightheadedness. She also reports headaches throughout the day and that her hands "blanch" 10-15 times a day. Reports hx of anxiety. Describes the pain as sharp in nature. Pt A&Ox4, resp e/u, and skin warm and dry.

## 2014-01-24 NOTE — ED Notes (Signed)
Dr. Plunkett at bedside.  

## 2014-01-24 NOTE — ED Notes (Signed)
Gave pt sprite and saltine crackers per MD

## 2014-01-24 NOTE — ED Notes (Signed)
Pt returned from CT °

## 2014-01-24 NOTE — ED Provider Notes (Addendum)
CSN: [id number]     Arrival date & time 01/24/14  1559 History   First MD Initiated Contact with Patient 01/24/14 1821     Chief Complaint  Patient presents with  . Chest Pain     (Consider location/radiation/quality/duration/timing/severity/associated sxs/prior Treatment) Patient is a 48 y.o. female presenting with chest pain. The history is provided by the patient and the spouse.  Chest Pain Pain location:  Substernal area Pain quality: sharp and tightness   Pain radiates to:  Does not radiate Pain radiates to the back: no   Pain severity:  Moderate Onset quality:  Gradual Duration:  1 month Timing:  Intermittent Progression:  Waxing and waning Chronicity:  New Context: breathing   Context comment:  Coughing. Relieved by:  None tried Worsened by:  Coughing and deep breathing Ineffective treatments:  None tried Associated symptoms: abdominal pain, anorexia, anxiety, back pain, cough, dizziness, headache, nausea, vomiting and weakness   Associated symptoms: no fever, no palpitations, no shortness of breath and no syncope   Headaches:    Severity:  Severe   Onset quality:  Gradual   Timing:  Intermittent   Progression:  Worsening   Chronicity:  Recurrent Nausea:    Severity:  Severe   Onset quality:  Gradual   Duration:  2 weeks   Timing:  Constant   Progression:  Worsening Vomiting:    Quality:  Stomach contents (states now everytime she tries to eat she vomits and even water is causing reflux)   Severity:  Moderate   Duration:  2 weeks   Timing:  Constant   Progression:  Worsening Risk factors: smoking   Risk factors: no coronary artery disease, no diabetes mellitus, no hypertension, no immobilization and no surgery     Past Medical History  Diagnosis Date  . Anxiety   . Depression   . Bipolar 1 disorder   . Bulging discs    Past Surgical History  Procedure Laterality Date  . Appendectomy    . Tubal ligation     History reviewed. No pertinent family  history. History  Substance Use Topics  . Smoking status: Current Every Day Smoker    Types: Cigarettes  . Smokeless tobacco: Not on file  . Alcohol Use: Yes     Comment: occasionally    OB History    No data available     Review of Systems  Constitutional: Positive for appetite change and unexpected weight change. Negative for fever.  Respiratory: Positive for cough. Negative for shortness of breath.   Cardiovascular: Positive for chest pain. Negative for palpitations and syncope.  Gastrointestinal: Positive for nausea, vomiting, abdominal pain and anorexia. Negative for diarrhea.  Genitourinary: Positive for flank pain. Negative for dysuria, frequency and difficulty urinating.  Musculoskeletal: Positive for back pain.  Neurological: Positive for dizziness, weakness and headaches.      Allergies  Acetaminophen; Codeine; and Penicillins  Home Medications   Prior to Admission medications   Medication Sig Start Date End Date Taking? Authorizing Provider  ALPRAZolam Prudy Feeler(XANAX) 0.5 MG tablet Take 0.5 mg by mouth 2 (two) times daily as needed for sleep. Anxiety   Yes Historical Provider, MD  amitriptyline (ELAVIL) 150 MG tablet Take 150 mg by mouth at bedtime.   Yes Historical Provider, MD  cyclobenzaprine (FLEXERIL) 10 MG tablet Take 1 tablet (10 mg total) by mouth 3 (three) times daily as needed (painful muscles). 09/27/13  Yes Ward GivensIva L Knapp, MD  naproxen (NAPROSYN) 500 MG tablet Take 1 tablet (500  mg total) by mouth 2 (two) times daily with a meal. 09/27/13  Yes Ward GivensIva L Knapp, MD  oxyCODONE (ROXICODONE) 5 MG immediate release tablet Take 1 tablet (5 mg total) by mouth every 6 (six) hours as needed for severe pain. 08/07/13  Yes Lyanne CoKevin M Campos, MD  PARoxetine (PAXIL) 20 MG tablet Take 60 mg by mouth daily.   Yes Historical Provider, MD  phenazopyridine (PYRIDIUM) 200 MG tablet Take 1 tablet (200 mg total) by mouth 3 (three) times daily as needed (painful bladder). 09/27/13  Yes Ward GivensIva L Knapp,  MD  albuterol (PROVENTIL HFA;VENTOLIN HFA) 108 (90 BASE) MCG/ACT inhaler Inhale 2 puffs into the lungs every 6 (six) hours as needed for wheezing. wheezing    Historical Provider, MD  levofloxacin (LEVAQUIN) 500 MG tablet Take 1 tablet (500 mg total) by mouth daily. Patient not taking: Reported on 01/24/2014 09/27/13   Ward GivensIva L Knapp, MD  ondansetron (ZOFRAN ODT) 8 MG disintegrating tablet Take 1 tablet (8 mg total) by mouth every 8 (eight) hours as needed for nausea or vomiting. Patient not taking: Reported on 01/24/2014 08/07/13   Lyanne CoKevin M Campos, MD  ondansetron (ZOFRAN) 4 MG tablet Take 1 tablet (4 mg total) by mouth every 8 (eight) hours as needed for nausea or vomiting. Patient not taking: Reported on 01/24/2014 09/27/13   Ward GivensIva L Knapp, MD   BP 120/71 mmHg  Pulse 69  Temp(Src) 98.1 F (36.7 C)  Resp 20  SpO2 100%  LMP 01/10/2014 Physical Exam  Constitutional: She is oriented to person, place, and time. She appears well-developed and well-nourished. No distress.  HENT:  Head: Normocephalic and atraumatic.  Right Ear: Tympanic membrane and ear canal normal.  Left Ear: Tympanic membrane and ear canal normal.  Mouth/Throat: Oropharynx is clear and moist.  Eyes: Conjunctivae and EOM are normal. Pupils are equal, round, and reactive to light.  Neck: Normal range of motion. Neck supple.  Cardiovascular: Normal rate, regular rhythm and intact distal pulses.   No murmur heard. Pulmonary/Chest: Effort normal and breath sounds normal. No respiratory distress. She has no wheezes. She has no rales. She exhibits tenderness and bony tenderness. She exhibits no crepitus and no deformity.    Abdominal: Soft. She exhibits no distension. There is tenderness in the epigastric area. There is CVA tenderness. There is no rebound and no guarding.  Mild left CVA tenderness  Musculoskeletal: Normal range of motion. She exhibits no edema or tenderness.  Neurological: She is alert and oriented to person, place,  and time.  Skin: Skin is warm and dry. No rash noted. No erythema.  Psychiatric: She has a normal mood and affect. Her behavior is normal.  Nursing note and vitals reviewed.   ED Course  Procedures (including critical care time) Labs Review Labs Reviewed  COMPREHENSIVE METABOLIC PANEL - Abnormal; Notable for the following:    Glucose, Bld 102 (*)    All other components within normal limits  CBC  TSH  I-STAT TROPOININ, ED    Imaging Review Dg Chest 2 View  01/24/2014   CLINICAL DATA:  Substernal chest pain.  Dizziness.  Vomiting.  EXAM: CHEST  2 VIEW  COMPARISON:  09/27/2013  FINDINGS: The heart size and mediastinal contours are within normal limits. Both lungs are clear. The visualized skeletal structures are unremarkable.  IMPRESSION: No active cardiopulmonary disease.   Electronically Signed   By: Myles RosenthalJohn  Stahl M.D.   On: 01/24/2014 17:53   Ct Head Wo Contrast  01/24/2014   CLINICAL  DATA:  Intermittent headaches, dizziness, nausea and vomiting for past months, worse/daily in past 2 weeks, history smoking  EXAM: CT HEAD WITHOUT CONTRAST  TECHNIQUE: Contiguous axial images were obtained from the base of the skull through the vertex without intravenous contrast.  COMPARISON:  None  FINDINGS: Normal ventricular morphology.  No midline shift or mass effect.  Normal appearance of brain parenchyma.  No intracranial hemorrhage, mass lesion, or acute infarction.  Visualized paranasal sinuses and mastoid air cells clear.  Bones unremarkable.  IMPRESSION: Normal exam.   Electronically Signed   By: Ulyses Southward M.D.   On: 01/24/2014 19:32     EKG Interpretation   Date/Time:  Thursday January 24 2014 16:05:52 EST Ventricular Rate:  70 PR Interval:  166 QRS Duration: 88 QT Interval:  432 QTC Calculation: 466 R Axis:   69 Text Interpretation:  Sinus rhythm with Premature atrial complexes with  Abberant conduction Cannot rule out Anterior infarct , age undetermined No  significant change  since last tracing Confirmed by [facility]  MD, Alphonzo Lemmings  [phone]) on 01/24/2014 6:52:13 PM      MDM   Final diagnoses:  Headache  Intractable vomiting with nausea, vomiting of unspecified type  Chest wall pain    Patient is here with multiple complaints. Initial she was stating for the last month she's had intermittent pleuritic type chest pain that's worse with coughing and deep breathing. She says she has a history of asthma and she supposed to have an inhaler but has not had one for some time. She does still smoke daily but denies fever or any infectious respiratory symptoms. She denies shortness of breath and no prior cardiac history. Perc negative.  Also patient complains of nausea and vomiting that started 2 weeks ago but for the last 3 days she's been unable to hold anything down and has not eaten. She states he can water gives her GERD-like symptoms. She states the vomiting started with a headache and she's had a persistent headache. She has a history of migraines and states these headaches feel similar to her typical migraine. She is complaining of feeling weak and dizzy upon standing since not eating and vomiting. She denies any persistent abdominal pain, diarrhea or fever.  She is also complaining of diffuse joint and muscle aches without notable swelling. Patient's labs including a troponin are within normal limits. Chest x-ray is normal. EKG is without significant findings except occasional PACs. TSH, urine and head CT pending giving the headache and persistent vomiting all ensure no increased intercranial pressure.  Patient given headache cocktail as well as IV fluids. Also feel that her stomach issues could be related to gastritis. She does not drink alcohol routinely and has no symptoms concerning for cholelithiasis, pancreatitis or other acute abdominal pathology.  8:28 PM Labs all wnl.  Head CT neg.  Pt improved after headache cocktail and will try po challenge.  9:25  PM Tolerating po's.  Will d/c home with zantac and zofran.  Gwyneth Sprout, MD 01/24/14 2126  Gwyneth Sprout, MD 01/24/14 2201

## 2016-11-30 ENCOUNTER — Encounter (HOSPITAL_COMMUNITY): Payer: Self-pay | Admitting: Emergency Medicine

## 2016-11-30 ENCOUNTER — Ambulatory Visit (HOSPITAL_COMMUNITY)
Admission: EM | Admit: 2016-11-30 | Discharge: 2016-11-30 | Disposition: A | Discharge status: Home / Self Care | Payer: Self-pay | Attending: Family Medicine | Admitting: Family Medicine

## 2016-11-30 DIAGNOSIS — B9689 Other specified bacterial agents as the cause of diseases classified elsewhere: Secondary | ICD-10-CM

## 2016-11-30 DIAGNOSIS — J019 Acute sinusitis, unspecified: Secondary | ICD-10-CM

## 2016-11-30 MED ORDER — DOXYCYCLINE HYCLATE 100 MG PO CAPS: 100.0000 mg | ORAL_CAPSULE | Freq: Two times a day (BID) | ORAL | 0 refills | Status: DC

## 2016-11-30 NOTE — ED Triage Notes (Signed)
PT reports cough, green phlegm, pain in face, dizziness for over a week.

## 2016-11-30 NOTE — ED Triage Notes (Signed)
PT also reports weakness and exertional SOB.

## 2016-11-30 NOTE — ED Provider Notes (Signed)
[facility]    CSN: [id number] Arrival date & time: 11/30/16  1009     History   Chief Complaint Chief Complaint  Patient presents with  . Facial Pain  . Shortness of Breath    HPI Tracie Jimenez is a 51 y.o. female.   Patient is a 51 yo F who presents with congestion. States the she first got sick several weeks ago with the change in weather. She initially got a little bit better but over the last 1 week has had worsening of her congestion and is now having dizziness. States that is coughing a lot causing her chest to hurt, productive with greenish sputum, has facial fullness and ear pain. Has both stuffy and runny nose. Has tried  OTC alka seltzer, mucus relief, cough drops, theraflu, robitussin without only mild relief. Had had fevers and chills at home with Tmax 101.26F 2 nights ago.      Past Medical History:  Diagnosis Date  . Anxiety   . Bipolar 1 disorder (HCC)   . Bulging discs   . Depression     There are no active problems to display for this patient.   Past Surgical History:  Procedure Laterality Date  . APPENDECTOMY    . TUBAL LIGATION      OB History    No data available       Home Medications    Prior to Admission medications   Medication Sig Start Date End Date Taking? Authorizing Provider  albuterol (PROVENTIL HFA;VENTOLIN HFA) 108 (90 BASE) MCG/ACT inhaler Inhale 2 puffs into the lungs every 6 (six) hours as needed for wheezing. wheezing    [provider]  ALPRAZolam (XANAX) 0.5 MG tablet Take 0.5 mg by mouth 2 (two) times daily as needed for sleep. Anxiety    [provider]  amitriptyline (ELAVIL) 150 MG tablet Take 150 mg by mouth at bedtime.    [provider]  cyclobenzaprine (FLEXERIL) 10 MG tablet Take 1 tablet (10 mg total) by mouth 3 (three) times daily as needed (painful muscles). 09/27/13   Devoria Albe, MD  levofloxacin (LEVAQUIN) 500 MG tablet Take 1 tablet (500 mg total) by mouth  daily. Patient not taking: Reported on 01/24/2014 09/27/13   Devoria Albe, MD  naproxen (NAPROSYN) 500 MG tablet Take 1 tablet (500 mg total) by mouth 2 (two) times daily with a meal. 09/27/13   Devoria Albe, MD  ondansetron (ZOFRAN ODT) 8 MG disintegrating tablet Take 1 tablet (8 mg total) by mouth every 8 (eight) hours as needed for nausea or vomiting. Patient not taking: Reported on 01/24/2014 08/07/13   Azalia Bilis, MD  ondansetron (ZOFRAN) 4 MG tablet Take 1 tablet (4 mg total) by mouth every 6 (six) hours. 01/24/14   Gwyneth Sprout, MD  oxyCODONE (ROXICODONE) 5 MG immediate release tablet Take 1 tablet (5 mg total) by mouth every 6 (six) hours as needed for severe pain. 08/07/13   Azalia Bilis, MD  PARoxetine (PAXIL) 20 MG tablet Take 60 mg by mouth daily.    [provider]  phenazopyridine (PYRIDIUM) 200 MG tablet Take 1 tablet (200 mg total) by mouth 3 (three) times daily as needed (painful bladder). 09/27/13   Devoria Albe, MD  ranitidine (ZANTAC) 150 MG tablet Take 1 tablet (150 mg total) by mouth 2 (two) times daily. 01/24/14   Gwyneth Sprout, MD    Family History No family history on file.  Social History Social History  Substance Use Topics  .  Smoking status: Current Every Day Smoker    Packs/day: 0.25    Types: Cigarettes  . Smokeless tobacco: Never Used  . Alcohol use Yes     Comment: occasionally      Allergies   Acetaminophen; Codeine; and Penicillins   Review of Systems Review of Systems  Constitutional: Positive for chills and fever. Negative for diaphoresis.  HENT: Positive for congestion, ear pain, rhinorrhea, sinus pain, sinus pressure and sore throat. Negative for ear discharge, facial swelling, hearing loss and sneezing.   Respiratory: Negative for chest tightness, shortness of breath and wheezing.   Cardiovascular: Negative for chest pain and palpitations.  Gastrointestinal: Negative for abdominal pain, diarrhea, nausea and vomiting.   Genitourinary: Negative for dysuria.  Musculoskeletal: Negative for arthralgias.  Skin: Negative for rash.  Neurological: Positive for dizziness. Negative for headaches.     Physical Exam Triage Vital Signs ED Triage Vitals  Enc Vitals Group     BP 11/30/16 1046 (!) 165/97     Pulse Rate 11/30/16 1046 (!) 101     Resp 11/30/16 1046 16     Temp 11/30/16 1046 97.6 F (36.4 C)     Temp Source 11/30/16 1046 Oral     SpO2 11/30/16 1046 92 %     Weight 11/30/16 1049 122 lb (55.3 kg)     Height --      Head Circumference --      Peak Flow --      Pain Score 11/30/16 1049 7     Pain Loc --      Pain Edu? --      Excl. in GC? --    No data found.   Updated Vital Signs BP (!) 165/97   Pulse (!) 101   Temp 97.6 F (36.4 C) (Oral)   Resp 16   Wt 122 lb (55.3 kg)   SpO2 92%   BMI 18.55 kg/m   Visual Acuity Right Eye Distance:   Left Eye Distance:   Bilateral Distance:    Right Eye Near:   Left Eye Near:    Bilateral Near:     Physical Exam  Constitutional: She is oriented to person, place, and time. She appears well-developed and well-nourished. No distress.  HENT:  Head: Normocephalic and atraumatic.  Right Ear: Tympanic membrane and external ear normal.  Left Ear: Tympanic membrane and external ear normal.  Nose: Nose normal.  Mouth/Throat: Oropharynx is clear and moist. No oropharyngeal exudate.  Maxillary tenderness  Eyes: Pupils are equal, round, and reactive to light. Conjunctivae and EOM are normal.  Neck: Normal range of motion. Neck supple.  Cardiovascular: Normal rate, regular rhythm, normal heart sounds and intact distal pulses.   No murmur heard. Pulmonary/Chest: Effort normal and breath sounds normal. No respiratory distress. She has no wheezes.  Abdominal: Soft. Bowel sounds are normal. She exhibits no distension. There is no tenderness. There is no rebound and no guarding.  Musculoskeletal: Normal range of motion.  Lymphadenopathy:    She has no  cervical adenopathy.  Neurological: She is alert and oriented to person, place, and time.  Skin: Skin is warm and dry. Capillary refill takes less than 2 seconds.  Psychiatric: She has a normal mood and affect.     UC Treatments / Results  Labs (all labs ordered are listed, but only abnormal results are displayed) Labs Reviewed - No data to display  EKG  EKG Interpretation None       Radiology No results found.  Procedures Procedures (including critical care time)  Medications Ordered in UC Medications - No data to display   Initial Impression / Assessment and Plan / UC Course  I have reviewed the triage vital signs and the nursing notes.  Pertinent labs & imaging results that were available during my care of the patient were reviewed by me and considered in my medical decision making (see chart for details).   Patient is a 51 yo F who presents with several weeks of nasal congestion, productive cough with greenish sputum and double worsening consistent without acute bacterial sinusitis. She is afebrile and nontoxic appearing today and lung exam is not concerning for a pneumonia. Since patient has intolerance to penicillins will treat with doxycycline for 7 days. Advised supportive care at home with good hydration and rest. Recommended continued use of OTC nasal congestants for symptomatic management. Return precautions given.   Final Clinical Impressions(s) / UC Diagnoses   Final diagnoses:  Acute bacterial sinusitis    New Prescriptions Discharge Medication List as of 11/30/2016 11:24 AM    START taking these medications   Details  doxycycline (VIBRAMYCIN) 100 MG capsule Take 1 capsule (100 mg total) by mouth 2 (two) times daily., Starting Tue 11/30/2016, Normal          Leland Her, DO 11/30/16 1137

## 2016-11-30 NOTE — Discharge Instructions (Signed)
Take antibiotic doxycycline twice a day for 7 days. Keep well hydrated. You can continue taking robitussin or mucinex to break up the phlegm and over the counter nasal decongestants to help your stuffy nose.

## 2018-07-18 ENCOUNTER — Ambulatory Visit (HOSPITAL_COMMUNITY)
Admission: EM | Admit: 2018-07-18 | Discharge: 2018-07-18 | Disposition: A | Discharge status: Home / Self Care | Payer: Self-pay | Attending: Family Medicine | Admitting: Family Medicine

## 2018-07-18 ENCOUNTER — Encounter (HOSPITAL_COMMUNITY): Payer: Self-pay | Admitting: Emergency Medicine

## 2018-07-18 DIAGNOSIS — J302 Other seasonal allergic rhinitis: Secondary | ICD-10-CM

## 2018-07-18 NOTE — ED Triage Notes (Signed)
Pt presents to [facility] after calling out of work yesterday for a fever.  Pt states she did not have a fever, and that she just didn't want to go to work.  Patient now needs a note stating she can return to work.

## 2018-07-19 NOTE — ED Provider Notes (Signed)
[facility]   [id number] 07/18/18 Arrival Time: 1054  ASSESSMENT & PLAN:  1. Seasonal allergies    OTC allergy medication as needed. Return to work note provided. May f/u here as needed.  Reviewed expectations re: course of current medical issues. Questions answered. Outlined signs and symptoms indicating need for more acute intervention. Patient verbalized understanding. After Visit Summary given.   SUBJECTIVE: History from: patient.  Tracie Jimenez is a 53 y.o. female who requests a return to work note. Reports calling out of work yesterday for nasal and chest congestion. All resolved now. Seasonal allergy trigger. No SOB or wheezing reported. Afebrile. Normal PO intake without n/v. Known sick contacts: none. No specific or significant aggravating or alleviating factors reported. OTC treatment: none reported.  Social History   Tobacco Use  Smoking Status Current Every Day Smoker  . Packs/day: 0.25  . Types: Cigarettes  Smokeless Tobacco Never Used    ROS: As per HPI.   OBJECTIVE:  Vitals:   07/18/18 1108  BP: 135/85  Pulse: 60  Resp: 16  Temp: 98.2 F (36.8 C)  SpO2: 100%     General appearance: alert; appears fatigued HEENT: nasal congestion; clear runny nose; throat irritation secondary to post-nasal drainage Neck: supple without LAD CV: RRR Lungs: unlabored respirations, symmetrical air entry with wheezing; cough: absent Abd: soft Ext: no LE edema Skin: warm and dry Psychological: alert and cooperative; normal mood and affect   Allergies  Allergen Reactions  . Acetaminophen Rash  . Codeine Nausea And Vomiting and Rash  . Penicillins Nausea And Vomiting and Rash    Past Medical History:  Diagnosis Date  . Anxiety   . Bipolar 1 disorder (HCC)   . Bulging discs   . Depression    History reviewed. No pertinent family history. Social History   Socioeconomic History  . Marital status: Single    Spouse name: Not on file  .  Number of children: Not on file  . Years of education: Not on file  . Highest education level: Not on file  Occupational History  . Not on file  Social Needs  . Financial resource strain: Not on file  . Food insecurity:    Worry: Not on file    Inability: Not on file  . Transportation needs:    Medical: Not on file    Non-medical: Not on file  Tobacco Use  . Smoking status: Current Every Day Smoker    Packs/day: 0.25    Types: Cigarettes  . Smokeless tobacco: Never Used  Substance and Sexual Activity  . Alcohol use: Yes    Comment: occasionally   . Drug use: No  . Sexual activity: Not on file  Lifestyle  . Physical activity:    Days per week: Not on file    Minutes per session: Not on file  . Stress: Not on file  Relationships  . Social connections:    Talks on phone: Not on file    Gets together: Not on file    Attends religious service: Not on file    Active member of club or organization: Not on file    Attends meetings of clubs or organizations: Not on file    Relationship status: Not on file  . Intimate partner violence:    Fear of current or ex partner: Not on file    Emotionally abused: Not on file    Physically abused: Not on file    Forced sexual activity: Not on file  Other Topics Concern  . Not on file  Social History Narrative  . Not on file           Mardella Layman, MD 07/19/18 [phone]

## 2018-12-25 ENCOUNTER — Emergency Department (HOSPITAL_COMMUNITY)
Admission: EM | Admit: 2018-12-25 | Discharge: 2018-12-25 | Disposition: A | Discharge status: Home / Self Care | Payer: Self-pay | Attending: Emergency Medicine | Admitting: Emergency Medicine

## 2018-12-25 ENCOUNTER — Encounter (HOSPITAL_COMMUNITY): Payer: Self-pay | Admitting: Emergency Medicine

## 2018-12-25 ENCOUNTER — Emergency Department (HOSPITAL_COMMUNITY): Payer: Self-pay

## 2018-12-25 DIAGNOSIS — R55 Syncope and collapse: Secondary | ICD-10-CM | POA: Insufficient documentation

## 2018-12-25 DIAGNOSIS — R826 Abnormal urine levels of substances chiefly nonmedicinal as to source: Secondary | ICD-10-CM | POA: Insufficient documentation

## 2018-12-25 DIAGNOSIS — F1290 Cannabis use, unspecified, uncomplicated: Secondary | ICD-10-CM | POA: Insufficient documentation

## 2018-12-25 LAB — CBC WITH DIFFERENTIAL/PLATELET
Abs Immature Granulocytes: 0.02 10*3/uL (ref 0.00–0.07)
Basophils Absolute: 0 10*3/uL (ref 0.0–0.1)
Basophils Relative: 1 %
Eosinophils Absolute: 0 10*3/uL (ref 0.0–0.5)
Eosinophils Relative: 0 %
HCT: 39.7 % (ref 36.0–46.0)
Hemoglobin: 13.6 g/dL (ref 12.0–15.0)
Immature Granulocytes: 0 %
Lymphocytes Relative: 15 %
Lymphs Abs: 1 10*3/uL (ref 0.7–4.0)
MCH: 31.4 pg (ref 26.0–34.0)
MCHC: 34.3 g/dL (ref 30.0–36.0)
MCV: 91.7 fL (ref 80.0–100.0)
Monocytes Absolute: 0.2 10*3/uL (ref 0.1–1.0)
Monocytes Relative: 2 %
Neutro Abs: 5.4 10*3/uL (ref 1.7–7.7)
Neutrophils Relative %: 82 %
Platelets: 182 10*3/uL (ref 150–400)
RBC: 4.33 MIL/uL (ref 3.87–5.11)
RDW: 12.1 % (ref 11.5–15.5)
WBC: 6.6 10*3/uL (ref 4.0–10.5)
nRBC: 0 % (ref 0.0–0.2)

## 2018-12-25 LAB — LACTIC ACID, PLASMA: Lactic Acid, Venous: 1.8 mmol/L (ref 0.5–1.9)

## 2018-12-25 LAB — COMPREHENSIVE METABOLIC PANEL
ALT: 13 U/L (ref 0–44)
AST: 18 U/L (ref 15–41)
Albumin: 3.7 g/dL (ref 3.5–5.0)
Alkaline Phosphatase: 60 U/L (ref 38–126)
Anion gap: 8 (ref 5–15)
BUN: 9 mg/dL (ref 6–20)
CO2: 23 mmol/L (ref 22–32)
Calcium: 7.9 mg/dL — ABNORMAL LOW (ref 8.9–10.3)
Chloride: 110 mmol/L (ref 98–111)
Creatinine, Ser: 0.66 mg/dL (ref 0.44–1.00)
GFR calc Af Amer: >60 mL/min (ref >60)
GFR calc non Af Amer: >60 mL/min (ref >60)
Glucose, Bld: 108 mg/dL — ABNORMAL HIGH (ref 70–99)
Potassium: 3.9 mmol/L (ref 3.5–5.1)
Sodium: 141 mmol/L (ref 135–145)
Total Bilirubin: 0.4 mg/dL (ref 0.3–1.2)
Total Protein: 5.8 g/dL — ABNORMAL LOW (ref 6.5–8.1)

## 2018-12-25 LAB — URINALYSIS, ROUTINE W REFLEX MICROSCOPIC
Bilirubin Urine: NEGATIVE
Glucose, UA: NEGATIVE mg/dL
Hgb urine dipstick: NEGATIVE
Ketones, ur: NEGATIVE mg/dL
Leukocytes,Ua: NEGATIVE
Nitrite: NEGATIVE
Protein, ur: 30 mg/dL — AB
Specific Gravity, Urine: 1.021 (ref 1.005–1.030)
pH: 5 (ref 5.0–8.0)

## 2018-12-25 LAB — RAPID URINE DRUG SCREEN, HOSP PERFORMED
Amphetamines: NOT DETECTED
Barbiturates: NOT DETECTED
Benzodiazepines: NOT DETECTED
Cocaine: POSITIVE — AB
Opiates: POSITIVE — AB
Tetrahydrocannabinol: POSITIVE — AB

## 2018-12-25 LAB — I-STAT BETA HCG BLOOD, ED (MC, WL, AP ONLY): I-stat hCG, quantitative: <5 m[IU]/mL (ref <5)

## 2018-12-25 MED ORDER — SODIUM CHLORIDE 0.9 % IV BOLUS
1000.0000 mL | Freq: Once | INTRAVENOUS | Status: AC
  Administered 2018-12-25: 16:00:00 1000 mL via INTRAVENOUS

## 2018-12-25 MED ORDER — SODIUM CHLORIDE 0.9 % IV BOLUS
1000.0000 mL | Freq: Once | INTRAVENOUS | Status: AC
  Administered 2018-12-25: 1000 mL via INTRAVENOUS

## 2018-12-25 NOTE — Discharge Instructions (Addendum)
Please begin taking a calcium supplement over-the-counter. Make sure to eat balanced meals regularly and drink plenty of water.  Please follow-up and establish care with a primary care letter by calling the number below.  Please return to the emergency department if you develop any new or worsening symptoms.

## 2018-12-25 NOTE — ED Provider Notes (Signed)
[facility] EMERGENCY DEPARTMENT Provider Note   CSN: [id number] Arrival date & time: 12/25/18  1235     History   Chief Complaint Chief Complaint  Patient presents with   possible OD    HPI Tracie Jimenez is a 53 y.o. female with history of anxiety, depression, bipolar 1 disorder who presents following syncopal episode. 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 Unsure if she hit her head, but she denies pain. Patient woke up to EMTs surrounding her she woke up following 2mg  Narcan. She denies drug or alcohol use. She hadn't eaten anything all day. She denies any symptoms, except that she is thirsty.     HPI  Past Medical History:  Diagnosis Date   Anxiety    Bipolar 1 disorder (HCC)    Bulging discs    Depression     There are no active problems to display for this patient.   Past Surgical History:  Procedure Laterality Date   APPENDECTOMY     TUBAL LIGATION       OB History   No obstetric history on file.      Home Medications    Prior to Admission medications   Medication Sig Start Date End Date Taking? Authorizing Provider  albuterol (PROVENTIL HFA;VENTOLIN HFA) 108 (90 BASE) MCG/ACT inhaler Inhale 2 puffs into the lungs every 6 (six) hours as needed for wheezing. wheezing    [provider]    Family History No family history on file.  Social History Social History   Tobacco Use   Smoking status: Current Every Day Smoker    Packs/day: 0.25    Types: Cigarettes   Smokeless tobacco: Never Used  Substance Use Topics   Alcohol use: Yes    Comment: occasionally    Drug use: No     Allergies   Acetaminophen, Codeine, and Penicillins   Review of Systems Review of Systems  Constitutional: Negative for chills and fever.  HENT: Negative for facial swelling and sore throat.   Respiratory: Negative for shortness of  breath.   Cardiovascular: Negative for chest pain.  Gastrointestinal: Negative for abdominal pain, nausea and vomiting.  Genitourinary: Negative for dysuria.  Musculoskeletal: Negative for back pain.  Skin: Negative for rash and wound.  Neurological: Positive for syncope. Negative for headaches.  Psychiatric/Behavioral: The patient is not nervous/anxious.      Physical Exam Updated Vital Signs BP 135/66 (BP Location: Right Arm)    Pulse 65    Temp 97.6 F (36.4 C)    Resp 20    Ht 5\' 8"  (1.727 m)    Wt 50.3 kg    SpO2 99%    BMI 16.88 kg/m   Physical Exam Vitals signs and nursing note reviewed.  Constitutional:      General: She is not in acute distress.    Appearance: She is well-developed. She is not diaphoretic.  HENT:     Head: Normocephalic and atraumatic.     Mouth/Throat:     Pharynx: No oropharyngeal exudate.  Eyes:     General: No scleral icterus.       Right eye: No discharge.        Left eye: No discharge.     Conjunctiva/sclera: Conjunctivae normal.     Pupils: Pupils are equal, round, and reactive to light.  Neck:     Musculoskeletal: Normal range of  motion and neck supple.     Thyroid: No thyromegaly.  Cardiovascular:     Rate and Rhythm: Normal rate and regular rhythm.     Heart sounds: Normal heart sounds. No murmur. No friction rub. No gallop.   Pulmonary:     Effort: Pulmonary effort is normal. No respiratory distress.     Breath sounds: Normal breath sounds. No stridor. No wheezing or rales.  Abdominal:     General: Bowel sounds are normal. There is no distension.     Palpations: Abdomen is soft.     Tenderness: There is no abdominal tenderness. There is no guarding or rebound.  Lymphadenopathy:     Cervical: No cervical adenopathy.  Skin:    General: Skin is warm and dry.     Coloration: Skin is not pale.     Findings: No rash.  Neurological:     Mental Status: She is alert.     Coordination: Coordination normal.      ED Treatments /  Results  Labs (all labs ordered are listed, but only abnormal results are displayed) Labs Reviewed  COMPREHENSIVE METABOLIC PANEL - Abnormal; Notable for the following components:      Result Value   Glucose, Bld 108 (*)    Calcium 7.9 (*)    Total Protein 5.8 (*)    All other components within normal limits  URINALYSIS, ROUTINE W REFLEX MICROSCOPIC - Abnormal; Notable for the following components:   APPearance CLOUDY (*)    Protein, ur 30 (*)    Bacteria, UA RARE (*)    All other components within normal limits  RAPID URINE DRUG SCREEN, HOSP PERFORMED - Abnormal; Notable for the following components:   Opiates POSITIVE (*)    Cocaine POSITIVE (*)    Tetrahydrocannabinol POSITIVE (*)    All other components within normal limits  CULTURE, BLOOD (ROUTINE X 2)  CULTURE, BLOOD (ROUTINE X 2)  CBC WITH DIFFERENTIAL/PLATELET  LACTIC ACID, PLASMA  I-STAT BETA HCG BLOOD, ED ([facility], [facility], [facility] ONLY)    EKG EKG Interpretation  Date/Time:  Monday December 25 2018 14:56:54 EST Ventricular Rate:  46 PR Interval:    QRS Duration: 117 QT Interval:  513 QTC Calculation: 449 R Axis:   83 Text Interpretation: Sinus bradycardia Nonspecific intraventricular conduction delay Artifact Abnormal ECG Confirmed by Gerhard MunchLockwood, Robert [phone]([id number]) on 12/25/2018 3:06:06 PM   Radiology Ct Head Wo Contrast  Result Date: 12/25/2018 CLINICAL DATA:  Syncope EXAM: CT HEAD WITHOUT CONTRAST TECHNIQUE: Contiguous axial images were obtained from the base of the skull through the vertex without intravenous contrast. COMPARISON:  01/25/2014 head CT FINDINGS: Brain: No evidence of acute infarction, hemorrhage, hydrocephalus, extra-axial collection or mass lesion/mass effect. Vascular: No hyperdense vessel or unexpected calcification. Skull: Normal. Negative for fracture or focal lesion. Sinuses/Orbits: No acute finding. Other: None IMPRESSION: Negative non contrasted CT appearance of the brain. Electronically Signed   By: Jasmine PangKim   Fujinaga M.D.   On: 12/25/2018 15:51    Procedures Procedures (including critical care time)  Medications Ordered in ED Medications  sodium chloride 0.9 % bolus 1,000 mL (0 mLs Intravenous Stopped 12/25/18 1710)  sodium chloride 0.9 % bolus 1,000 mL (0 mLs Intravenous Stopped 12/25/18 1811)     Initial Impression / Assessment and Plan / ED Course  I have reviewed the triage vital signs and the nursing notes.  Pertinent labs & imaging results that were available during my care of the patient were reviewed by me and considered in my  medical decision making (see chart for details).        Patient presenting following possible drug overdose, considering awakening after Narcan. UDS was positive for opiates, cocaine, and THC. When questioned about this, patient admitted to marijuana use, but adamantly denied the other two. She states she did smoke marijuana last night and it looked different from normal. Question drug was laced as patient thanked me for making her aware of this and seemed quite alarmed by it. CT head was negative. Otherwise labs are unremarkable except for mild hypocalcemia, recommended supplementation. Patient initially hypothermic and bradycardic, both of which resolved after warm NS. Patient is feeling well and no rebound symptoms after 6 hours in ED. Return precautions discussed. Patient understands and agrees with plan. Patient vitals stable and discharged in satisfactory condition. I discussed patient case with Dr. Vanita Panda who guided the patient's management and agrees with plan.   Final Clinical Impressions(s) / ED Diagnoses   Final diagnoses:  Syncope and collapse    ED Discharge Orders    None       Caryl Ada 12/25/18 2217    Carmin Muskrat, MD 12/25/18 2330

## 2018-12-25 NOTE — ED Notes (Addendum)
Pt states she would like to go home.  PA notified.

## 2018-12-25 NOTE — ED Triage Notes (Signed)
Pt arrives with [facility] for possible OD per [facility] a [employer] officer [place] [employer] [place] and he noticed usual behavior and then he saw patient laying on floor unclear if she passed out of what- on ems arrival pt was altered and immediatly after given 2MG   narcan IN she was alert and ox4- denies using any drugs. Pt is ambulatory in triage has no injury if she did fall- denies any pain- reports being thirsty.

## 2018-12-30 LAB — CULTURE, BLOOD (ROUTINE X 2)
Culture: NO GROWTH
Culture: NO GROWTH

## 2021-01-08 IMAGING — CT CT HEAD W/O CM
4 series · 17 of 47 positions shown, 19 images · non-contrast
Comparison: 01/25/2014 head CT

CLINICAL DATA: Syncope

EXAM:
CT HEAD WITHOUT CONTRAST
TECHNIQUE: Contiguous axial images were obtained from the base of the skull
through the vertex without intravenous contrast.

[Series 3: head without · axial · non-contrast · 0.41mm/px · z∈[-98,+22]mm · 7 of 32 slices shown, 9 images]
[im 4/32  brain]
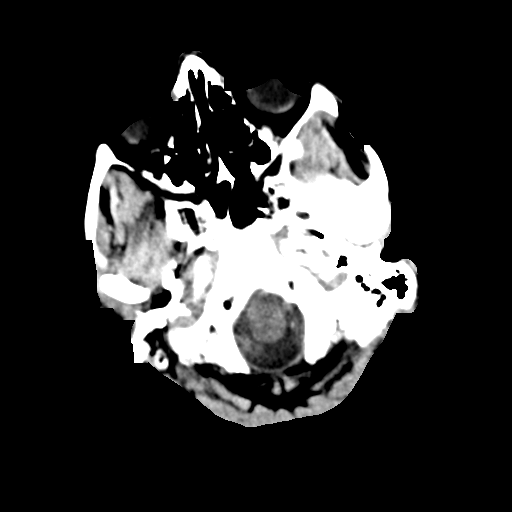
[im 4/32  bone]
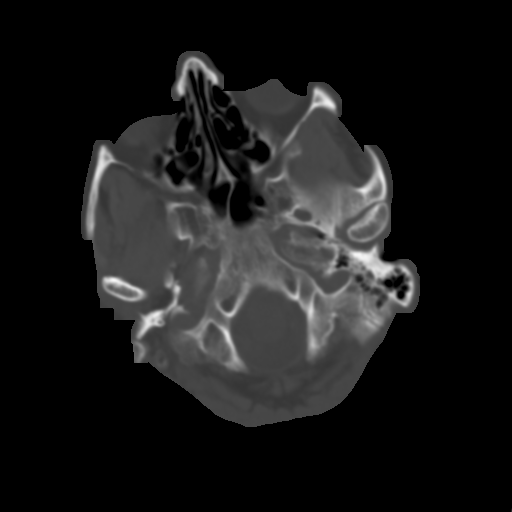
[im 8/32  brain]
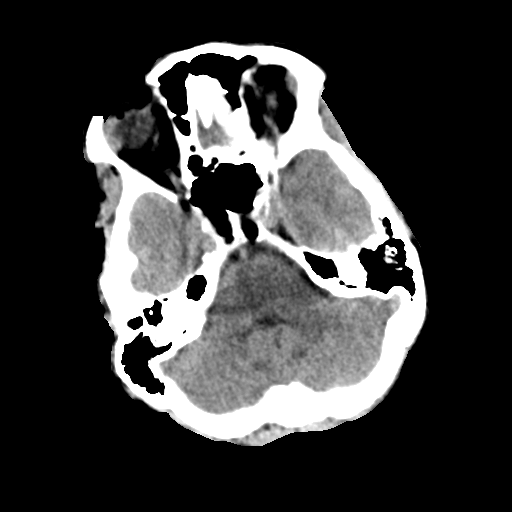
[im 12/32  brain]
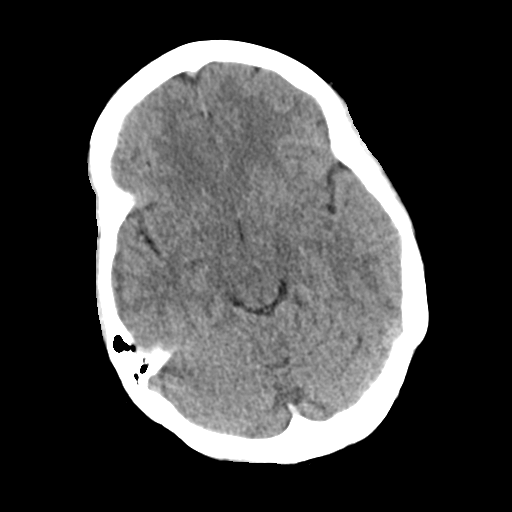
[im 16/32  brain]
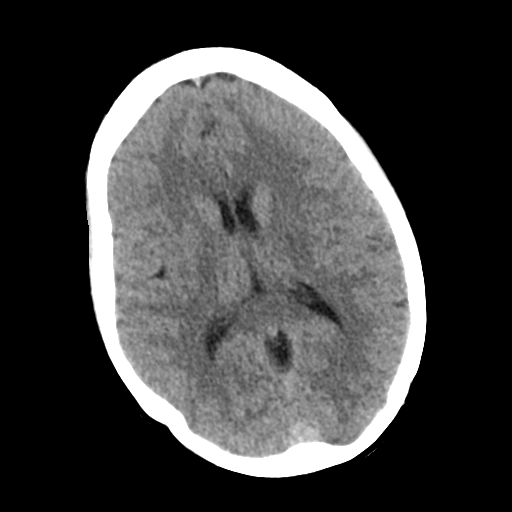
[im 20/32  brain]
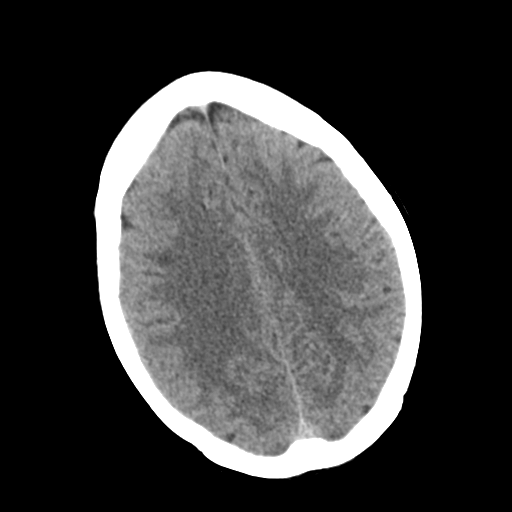
[im 20/32  bone]
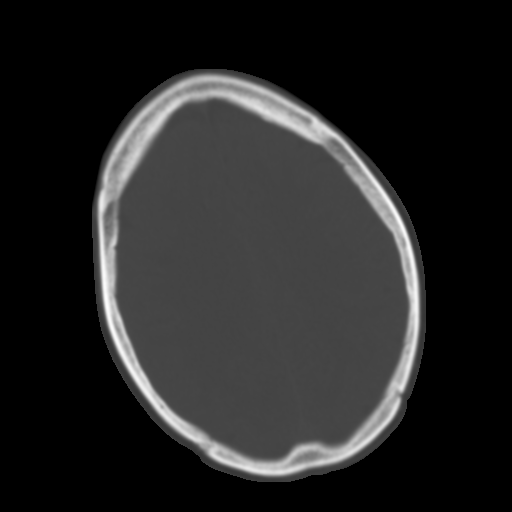
[im 24/32  brain]
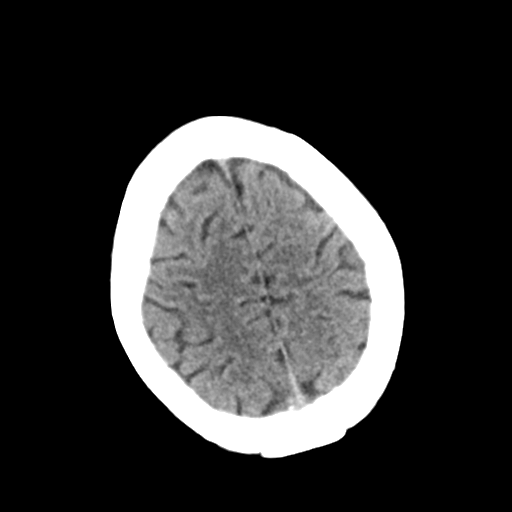
[im 28/32  brain]
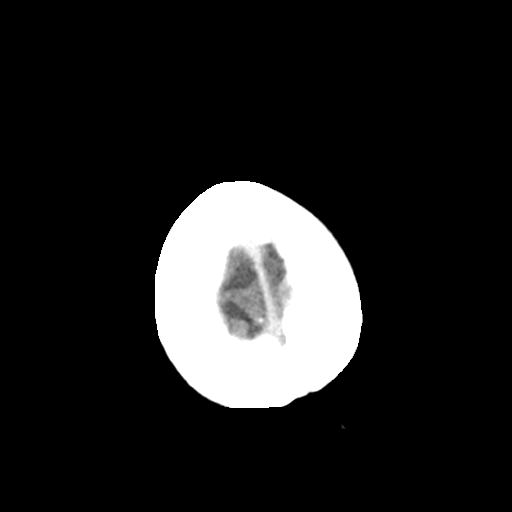

[Series 4: head bone · axial · 0.41mm/px · z∈[-99,-43]mm · 4 of 80 slices shown]
[im 8/80  bone]
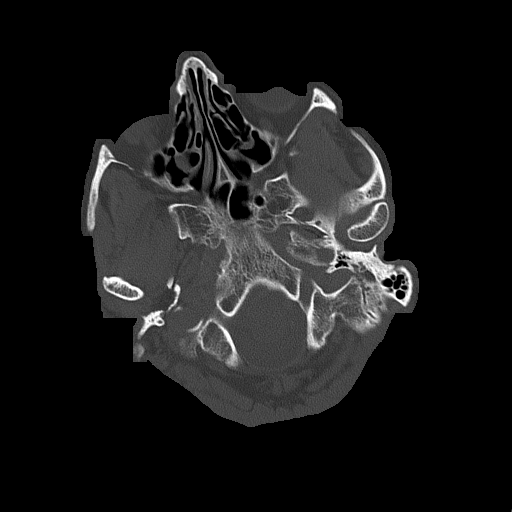
[im 16/80  bone]
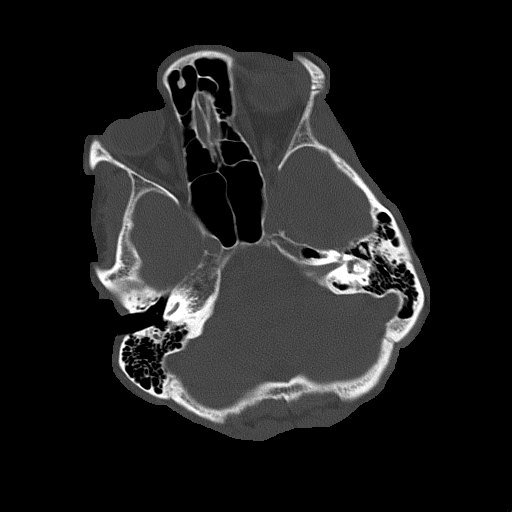
[im 24/80  bone]
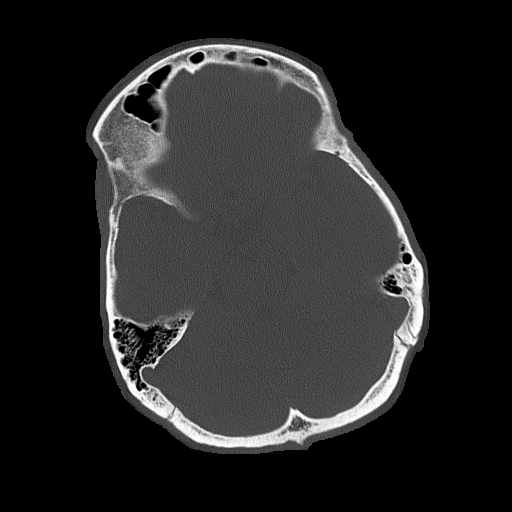
[im 36/80  bone]
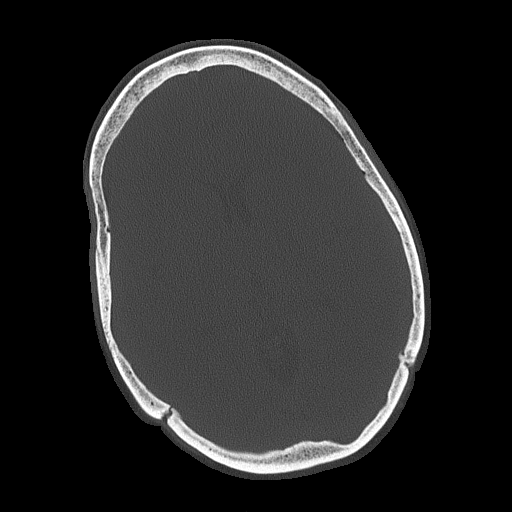

[Series 5: head without cor · coronal · non-contrast · 0.28mm/px · 3 of 62 slices shown]
[im 21/62  brain]
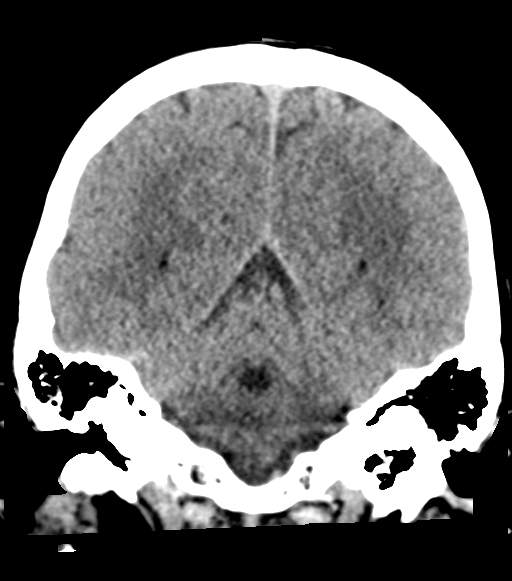
[im 28/62  brain]
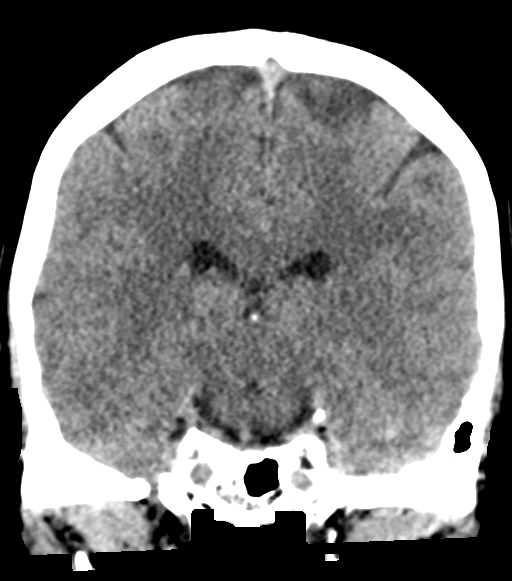
[im 34/62  brain]
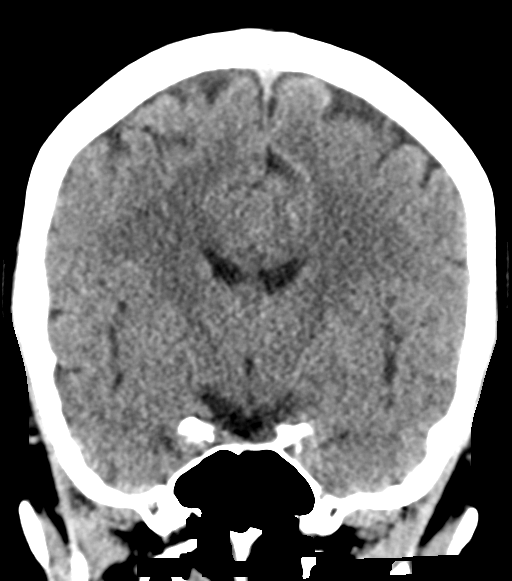

[Series 6: head without sag · sagittal · non-contrast · 0.31mm/px · 3 of 48 slices shown]
[im 16/48  brain]
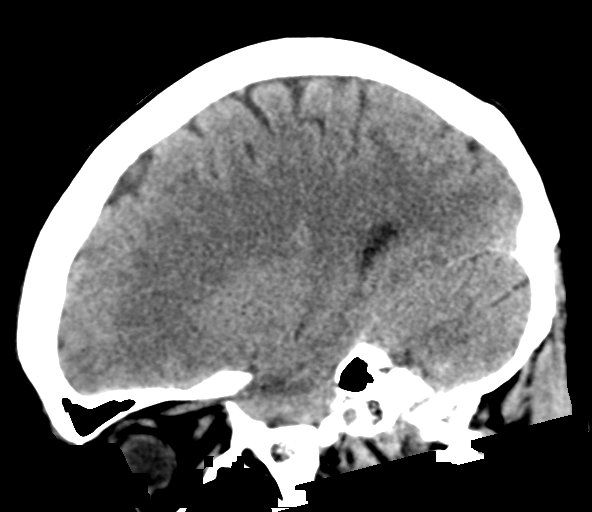
[im 24/48  brain]
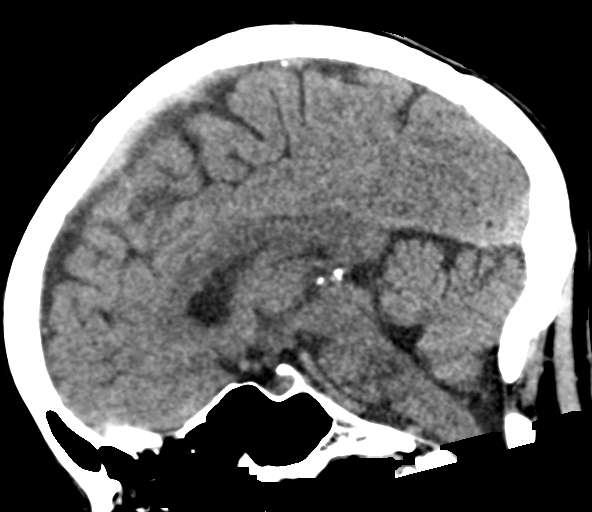
[im 32/48  brain]
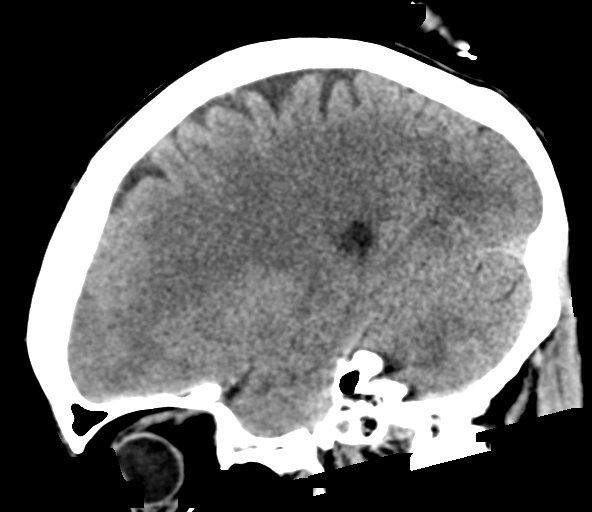

[17 of 47 positions shown; findings below may reference images not displayed]

FINDINGS: Brain: No evidence of acute infarction, hemorrhage, hydrocephalus,
extra-axial collection or mass lesion/mass effect.

Vascular: No hyperdense vessel or unexpected calcification.

Skull: Normal. Negative for fracture or focal lesion.

Sinuses/Orbits: No acute finding.

Other: None
IMPRESSION: Negative non contrasted CT appearance of the brain.

## 2021-01-20 ENCOUNTER — Other Ambulatory Visit: Payer: Self-pay

## 2021-01-20 ENCOUNTER — Encounter (HOSPITAL_COMMUNITY): Payer: Self-pay | Admitting: Emergency Medicine

## 2021-01-20 ENCOUNTER — Emergency Department (HOSPITAL_COMMUNITY)
Admission: EM | Admit: 2021-01-20 | Discharge: 2021-01-20 | Disposition: A | Discharge status: Home / Self Care | Payer: Self-pay | Attending: Emergency Medicine | Admitting: Emergency Medicine

## 2021-01-20 DIAGNOSIS — M79671 Pain in right foot: Secondary | ICD-10-CM | POA: Insufficient documentation

## 2021-01-20 DIAGNOSIS — R600 Localized edema: Secondary | ICD-10-CM | POA: Insufficient documentation

## 2021-01-20 DIAGNOSIS — R609 Edema, unspecified: Secondary | ICD-10-CM

## 2021-01-20 DIAGNOSIS — F17210 Nicotine dependence, cigarettes, uncomplicated: Secondary | ICD-10-CM | POA: Insufficient documentation

## 2021-01-20 LAB — I-STAT CHEM 8, ED
BUN: 9 mg/dL (ref 6–20)
Calcium, Ion: 1.15 mmol/L (ref 1.15–1.40)
Chloride: 100 mmol/L (ref 98–111)
Creatinine, Ser: 0.5 mg/dL (ref 0.44–1.00)
Glucose, Bld: 88 mg/dL (ref 70–99)
HCT: 37 % (ref 36.0–46.0)
Hemoglobin: 12.6 g/dL (ref 12.0–15.0)
Potassium: 3.8 mmol/L (ref 3.5–5.1)
Sodium: 139 mmol/L (ref 135–145)
TCO2: 28 mmol/L (ref 22–32)

## 2021-01-20 MED ORDER — FUROSEMIDE 20 MG PO TABS: 20.0000 mg | ORAL_TABLET | Freq: Every day | ORAL | 0 refills | Status: AC

## 2021-01-20 NOTE — ED Triage Notes (Signed)
Patient presents with right foot pain and swelling. She has had the pain for a year. She believes the pain originates from a callused area on the bottom of her foot.

## 2021-01-20 NOTE — ED Provider Notes (Signed)
[facility]-EMERGENCY DEPT Provider Note   CSN: [id number] Arrival date & time: 01/20/21  2778     History Chief Complaint  Patient presents with   Foot Pain    Tracie Jimenez is a 55 y.o. female.  55 year old female presents with several month history of bilateral lower extremity edema along with pain to the bottom of her right foot.  Patient states that she stands for prolonged periods every day.  States that her edema has become worse over the last week.  Notes that it does get better when she is resting.  Denies any cough or shortness of breath.  Denies any taking medications for this.  Notes that she does have calluses on her feet and subsequently finding shoes that fit appropriately      Past Medical History:  Diagnosis Date   Anxiety    Bipolar 1 disorder (HCC)    Bulging discs    Depression     There are no problems to display for this patient.   Past Surgical History:  Procedure Laterality Date   APPENDECTOMY     TUBAL LIGATION       OB History   No obstetric history on file.     History reviewed. No pertinent family history.  Social History   Tobacco Use   Smoking status: Every Day    Packs/day: 0.25    Types: Cigarettes   Smokeless tobacco: Never  Substance Use Topics   Alcohol use: Yes    Comment: occasionally    Drug use: No    Home Medications Prior to Admission medications   Medication Sig Start Date End Date Taking? Authorizing Provider  albuterol (PROVENTIL HFA;VENTOLIN HFA) 108 (90 BASE) MCG/ACT inhaler Inhale 2 puffs into the lungs every 6 (six) hours as needed for wheezing. wheezing    [provider]    Allergies    Acetaminophen, Codeine, and Penicillins  Review of Systems   Review of Systems  All other systems reviewed and are negative.  Physical Exam Updated Vital Signs BP 123/71 (BP Location: Right Arm)   Pulse 80   Temp 97.7 F (36.5 C) (Oral)   Resp 16   SpO2 96%   Physical  Exam Vitals and nursing note reviewed.  Constitutional:      General: She is not in acute distress.    Appearance: Normal appearance. She is well-developed. She is not toxic-appearing.  HENT:     Head: Normocephalic and atraumatic.  Eyes:     General: Lids are normal.     Conjunctiva/sclera: Conjunctivae normal.     Pupils: Pupils are equal, round, and reactive to light.  Neck:     Thyroid: No thyroid mass.     Trachea: No tracheal deviation.  Cardiovascular:     Rate and Rhythm: Normal rate and regular rhythm.     Heart sounds: Normal heart sounds. No murmur heard.   No gallop.  Pulmonary:     Effort: Pulmonary effort is normal. No respiratory distress.     Breath sounds: Normal breath sounds. No stridor. No decreased breath sounds, wheezing, rhonchi or rales.  Abdominal:     General: Bowel sounds are normal. There is no distension.     Palpations: Abdomen is soft.     Tenderness: There is no abdominal tenderness. There is no rebound.  Musculoskeletal:        General: No tenderness. Normal range of motion.     Cervical back: Normal range of motion and  neck supple.       Feet:  Lymphadenopathy:     Comments: 2+ bilateral lower extremity pitting edema  Skin:    General: Skin is warm and dry.     Findings: No abrasion or rash.  Neurological:     Mental Status: She is alert and oriented to person, place, and time.     GCS: GCS eye subscore is 4. GCS verbal subscore is 5. GCS motor subscore is 6.     Cranial Nerves: No cranial nerve deficit.     Sensory: No sensory deficit.  Psychiatric:        Speech: Speech normal.        Behavior: Behavior normal.    ED Results / Procedures / Treatments   Labs (all labs ordered are listed, but only abnormal results are displayed) Labs Reviewed - No data to display  EKG None  Radiology No results found.  Procedures Procedures   Medications Ordered in ED Medications - No data to display  ED Course  I have reviewed the  triage vital signs and the nursing notes.  Pertinent labs & imaging results that were available during my care of the patient were reviewed by me and considered in my medical decision making (see chart for details).    MDM Rules/Calculators/A&P                           Patient's electrolytes here are appropriate.  No evidence of renal involvement.  Will discharge home Final Clinical Impression(s) / ED Diagnoses Final diagnoses:  None    Rx / DC Orders ED Discharge Orders     None        Lorre Nick, MD 01/20/21 1349

## 2021-01-20 NOTE — Discharge Instructions (Addendum)
Take 1 tablet of the fluid pill a day to help with the swelling of your legs.  Follow-up in the clinic

## 2021-03-05 ENCOUNTER — Inpatient Hospital Stay: Payer: Self-pay | Admitting: Family Medicine
